# Patient Record
Sex: Female | Born: 1960 | Race: White | Hispanic: No | Marital: Married | Smoking: Former smoker
Health system: Southern US, Community
[De-identification: ages and names within clinical notes are randomized; demographics above are authoritative.]

## PROBLEM LIST (undated history)

## (undated) DIAGNOSIS — I2510 Atherosclerotic heart disease of native coronary artery without angina pectoris: Secondary | ICD-10-CM

## (undated) DIAGNOSIS — Z923 Personal history of irradiation: Secondary | ICD-10-CM

## (undated) DIAGNOSIS — C801 Malignant (primary) neoplasm, unspecified: Secondary | ICD-10-CM

## (undated) DIAGNOSIS — I10 Essential (primary) hypertension: Secondary | ICD-10-CM

## (undated) DIAGNOSIS — C50919 Malignant neoplasm of unspecified site of unspecified female breast: Secondary | ICD-10-CM

## (undated) HISTORY — DX: Essential (primary) hypertension: I10

## (undated) HISTORY — PX: OOPHORECTOMY: SHX86

## (undated) HISTORY — DX: Atherosclerotic heart disease of native coronary artery without angina pectoris: I25.10

## (undated) HISTORY — PX: BREAST LUMPECTOMY: SHX2

---

## 1992-03-12 HISTORY — PX: BACK SURGERY: SHX140

## 2001-03-12 DIAGNOSIS — C50919 Malignant neoplasm of unspecified site of unspecified female breast: Secondary | ICD-10-CM

## 2001-03-12 HISTORY — PX: BREAST LUMPECTOMY: SHX2

## 2001-03-12 HISTORY — DX: Malignant neoplasm of unspecified site of unspecified female breast: C50.919

## 2001-05-12 ENCOUNTER — Ambulatory Visit: Admission: RE | Admit: 2001-05-12 | Discharge: 2001-08-10 | Payer: Self-pay | Admitting: *Deleted

## 2005-02-22 ENCOUNTER — Other Ambulatory Visit: Admission: RE | Admit: 2005-02-22 | Discharge: 2005-02-22 | Payer: Self-pay | Admitting: Family Medicine

## 2005-08-01 ENCOUNTER — Encounter: Admission: RE | Admit: 2005-08-01 | Discharge: 2005-08-01 | Payer: Self-pay | Admitting: Hematology and Oncology

## 2005-12-31 ENCOUNTER — Encounter: Admission: RE | Admit: 2005-12-31 | Discharge: 2005-12-31 | Payer: Self-pay | Admitting: Hematology and Oncology

## 2006-06-24 ENCOUNTER — Other Ambulatory Visit: Admission: RE | Admit: 2006-06-24 | Discharge: 2006-06-24 | Payer: Self-pay | Admitting: Family Medicine

## 2007-05-10 ENCOUNTER — Emergency Department (HOSPITAL_COMMUNITY): Admission: EM | Admit: 2007-05-10 | Discharge: 2007-05-10 | Payer: Self-pay | Admitting: Emergency Medicine

## 2007-05-29 ENCOUNTER — Encounter: Admission: RE | Admit: 2007-05-29 | Discharge: 2007-05-29 | Payer: Self-pay | Admitting: Hematology and Oncology

## 2009-03-31 ENCOUNTER — Other Ambulatory Visit: Admission: RE | Admit: 2009-03-31 | Discharge: 2009-03-31 | Payer: Self-pay | Admitting: Family Medicine

## 2009-04-05 ENCOUNTER — Encounter: Admission: RE | Admit: 2009-04-05 | Discharge: 2009-04-05 | Payer: Self-pay | Admitting: Family Medicine

## 2009-09-05 ENCOUNTER — Encounter: Admission: RE | Admit: 2009-09-05 | Discharge: 2009-09-05 | Payer: Self-pay | Admitting: Hematology and Oncology

## 2010-04-02 ENCOUNTER — Encounter: Payer: Self-pay | Admitting: Hematology and Oncology

## 2010-12-20 ENCOUNTER — Other Ambulatory Visit: Payer: Self-pay | Admitting: Hematology and Oncology

## 2010-12-20 DIAGNOSIS — Z1231 Encounter for screening mammogram for malignant neoplasm of breast: Secondary | ICD-10-CM

## 2011-05-22 ENCOUNTER — Other Ambulatory Visit: Payer: Self-pay | Admitting: Family Medicine

## 2011-05-22 DIAGNOSIS — R110 Nausea: Secondary | ICD-10-CM

## 2011-05-22 DIAGNOSIS — IMO0001 Reserved for inherently not codable concepts without codable children: Secondary | ICD-10-CM

## 2011-05-25 ENCOUNTER — Ambulatory Visit
Admission: RE | Admit: 2011-05-25 | Discharge: 2011-05-25 | Disposition: A | Discharge status: Home / Self Care | Payer: BC Managed Care – PPO | Source: Ambulatory Visit | Attending: Family Medicine | Admitting: Family Medicine

## 2011-05-25 DIAGNOSIS — R110 Nausea: Secondary | ICD-10-CM

## 2011-05-25 DIAGNOSIS — IMO0001 Reserved for inherently not codable concepts without codable children: Secondary | ICD-10-CM

## 2011-05-29 ENCOUNTER — Other Ambulatory Visit (HOSPITAL_COMMUNITY): Payer: Self-pay | Admitting: Family Medicine

## 2011-05-29 DIAGNOSIS — R110 Nausea: Secondary | ICD-10-CM

## 2011-06-11 ENCOUNTER — Other Ambulatory Visit (HOSPITAL_COMMUNITY): Payer: BC Managed Care – PPO

## 2011-12-24 ENCOUNTER — Other Ambulatory Visit: Payer: Self-pay | Admitting: Hematology and Oncology

## 2011-12-24 DIAGNOSIS — Z1231 Encounter for screening mammogram for malignant neoplasm of breast: Secondary | ICD-10-CM

## 2012-01-14 ENCOUNTER — Ambulatory Visit
Admission: RE | Admit: 2012-01-14 | Discharge: 2012-01-14 | Disposition: A | Discharge status: Home / Self Care | Payer: BC Managed Care – PPO | Source: Ambulatory Visit | Attending: Hematology and Oncology | Admitting: Hematology and Oncology

## 2012-01-14 DIAGNOSIS — Z1231 Encounter for screening mammogram for malignant neoplasm of breast: Secondary | ICD-10-CM

## 2012-02-12 ENCOUNTER — Other Ambulatory Visit: Payer: Self-pay | Admitting: Family Medicine

## 2012-02-12 ENCOUNTER — Other Ambulatory Visit (HOSPITAL_COMMUNITY)
Admission: RE | Admit: 2012-02-12 | Discharge: 2012-02-12 | Disposition: A | Discharge status: Home / Self Care | Payer: BC Managed Care – PPO | Source: Ambulatory Visit | Attending: Family Medicine | Admitting: Family Medicine

## 2012-02-12 DIAGNOSIS — Z1151 Encounter for screening for human papillomavirus (HPV): Secondary | ICD-10-CM | POA: Insufficient documentation

## 2012-02-12 DIAGNOSIS — Z124 Encounter for screening for malignant neoplasm of cervix: Secondary | ICD-10-CM | POA: Insufficient documentation

## 2013-04-07 ENCOUNTER — Other Ambulatory Visit: Payer: Self-pay

## 2013-04-07 DIAGNOSIS — Z1231 Encounter for screening mammogram for malignant neoplasm of breast: Secondary | ICD-10-CM

## 2013-04-08 ENCOUNTER — Ambulatory Visit
Admission: RE | Admit: 2013-04-08 | Discharge: 2013-04-08 | Disposition: A | Discharge status: Home / Self Care | Payer: Self-pay | Source: Ambulatory Visit

## 2013-04-08 DIAGNOSIS — Z1231 Encounter for screening mammogram for malignant neoplasm of breast: Secondary | ICD-10-CM

## 2013-04-20 ENCOUNTER — Ambulatory Visit: Payer: BC Managed Care – PPO

## 2015-03-03 ENCOUNTER — Other Ambulatory Visit: Payer: Self-pay

## 2015-03-03 DIAGNOSIS — Z1231 Encounter for screening mammogram for malignant neoplasm of breast: Secondary | ICD-10-CM

## 2015-03-21 ENCOUNTER — Ambulatory Visit: Payer: Self-pay

## 2016-04-23 ENCOUNTER — Ambulatory Visit
Admission: RE | Admit: 2016-04-23 | Discharge: 2016-04-23 | Disposition: A | Discharge status: Home / Self Care | Payer: BLUE CROSS/BLUE SHIELD | Source: Ambulatory Visit

## 2016-04-23 DIAGNOSIS — Z1231 Encounter for screening mammogram for malignant neoplasm of breast: Secondary | ICD-10-CM | POA: Diagnosis not present

## 2016-04-23 HISTORY — DX: Malignant (primary) neoplasm, unspecified: C80.1

## 2016-04-23 HISTORY — DX: Malignant neoplasm of unspecified site of unspecified female breast: C50.919

## 2016-06-05 DIAGNOSIS — F411 Generalized anxiety disorder: Secondary | ICD-10-CM | POA: Diagnosis not present

## 2016-06-05 DIAGNOSIS — K219 Gastro-esophageal reflux disease without esophagitis: Secondary | ICD-10-CM | POA: Diagnosis not present

## 2016-06-05 DIAGNOSIS — J4520 Mild intermittent asthma, uncomplicated: Secondary | ICD-10-CM | POA: Diagnosis not present

## 2016-06-05 DIAGNOSIS — J309 Allergic rhinitis, unspecified: Secondary | ICD-10-CM | POA: Diagnosis not present

## 2017-04-29 ENCOUNTER — Other Ambulatory Visit: Payer: Self-pay | Admitting: Family Medicine

## 2017-04-29 DIAGNOSIS — Z1231 Encounter for screening mammogram for malignant neoplasm of breast: Secondary | ICD-10-CM

## 2017-05-17 ENCOUNTER — Ambulatory Visit
Admission: RE | Admit: 2017-05-17 | Discharge: 2017-05-17 | Disposition: A | Discharge status: Home / Self Care | Payer: BLUE CROSS/BLUE SHIELD | Source: Ambulatory Visit | Attending: Family Medicine | Admitting: Family Medicine

## 2017-05-17 DIAGNOSIS — Z1231 Encounter for screening mammogram for malignant neoplasm of breast: Secondary | ICD-10-CM | POA: Diagnosis not present

## 2017-09-30 DIAGNOSIS — Z853 Personal history of malignant neoplasm of breast: Secondary | ICD-10-CM | POA: Diagnosis not present

## 2017-09-30 DIAGNOSIS — F411 Generalized anxiety disorder: Secondary | ICD-10-CM | POA: Diagnosis not present

## 2017-09-30 DIAGNOSIS — Z1159 Encounter for screening for other viral diseases: Secondary | ICD-10-CM | POA: Diagnosis not present

## 2017-10-10 DIAGNOSIS — Z1211 Encounter for screening for malignant neoplasm of colon: Secondary | ICD-10-CM | POA: Diagnosis not present

## 2017-12-03 DIAGNOSIS — J309 Allergic rhinitis, unspecified: Secondary | ICD-10-CM | POA: Diagnosis not present

## 2017-12-03 DIAGNOSIS — Z6822 Body mass index (BMI) 22.0-22.9, adult: Secondary | ICD-10-CM | POA: Diagnosis not present

## 2017-12-03 DIAGNOSIS — J4520 Mild intermittent asthma, uncomplicated: Secondary | ICD-10-CM | POA: Diagnosis not present

## 2017-12-03 DIAGNOSIS — R030 Elevated blood-pressure reading, without diagnosis of hypertension: Secondary | ICD-10-CM | POA: Diagnosis not present

## 2018-10-10 ENCOUNTER — Other Ambulatory Visit: Payer: Self-pay | Admitting: Family Medicine

## 2018-10-10 DIAGNOSIS — Z1231 Encounter for screening mammogram for malignant neoplasm of breast: Secondary | ICD-10-CM

## 2019-04-24 ENCOUNTER — Ambulatory Visit
Admission: RE | Admit: 2019-04-24 | Discharge: 2019-04-24 | Disposition: A | Discharge status: Home / Self Care | Payer: 59 | Source: Ambulatory Visit | Attending: Family Medicine | Admitting: Family Medicine

## 2019-04-24 ENCOUNTER — Other Ambulatory Visit: Payer: Self-pay

## 2019-04-24 DIAGNOSIS — Z1231 Encounter for screening mammogram for malignant neoplasm of breast: Secondary | ICD-10-CM

## 2020-05-11 DIAGNOSIS — Z0000 Encounter for general adult medical examination without abnormal findings: Secondary | ICD-10-CM | POA: Diagnosis not present

## 2020-05-11 DIAGNOSIS — Z13220 Encounter for screening for lipoid disorders: Secondary | ICD-10-CM | POA: Diagnosis not present

## 2020-05-11 DIAGNOSIS — Z124 Encounter for screening for malignant neoplasm of cervix: Secondary | ICD-10-CM | POA: Diagnosis not present

## 2020-05-11 DIAGNOSIS — F411 Generalized anxiety disorder: Secondary | ICD-10-CM | POA: Diagnosis not present

## 2020-05-11 DIAGNOSIS — B351 Tinea unguium: Secondary | ICD-10-CM | POA: Diagnosis not present

## 2020-05-11 DIAGNOSIS — J4520 Mild intermittent asthma, uncomplicated: Secondary | ICD-10-CM | POA: Diagnosis not present

## 2020-05-11 DIAGNOSIS — K219 Gastro-esophageal reflux disease without esophagitis: Secondary | ICD-10-CM | POA: Diagnosis not present

## 2020-10-05 DIAGNOSIS — U071 COVID-19: Secondary | ICD-10-CM | POA: Diagnosis not present

## 2020-11-07 ENCOUNTER — Other Ambulatory Visit: Payer: Self-pay | Admitting: Family Medicine

## 2020-11-07 DIAGNOSIS — Z1231 Encounter for screening mammogram for malignant neoplasm of breast: Secondary | ICD-10-CM

## 2020-12-14 ENCOUNTER — Other Ambulatory Visit: Payer: Self-pay

## 2020-12-14 ENCOUNTER — Ambulatory Visit
Admission: RE | Admit: 2020-12-14 | Discharge: 2020-12-14 | Disposition: A | Discharge status: Home / Self Care | Payer: BLUE CROSS/BLUE SHIELD | Source: Ambulatory Visit | Attending: Family Medicine | Admitting: Family Medicine

## 2020-12-14 DIAGNOSIS — Z1231 Encounter for screening mammogram for malignant neoplasm of breast: Secondary | ICD-10-CM | POA: Diagnosis not present

## 2021-05-22 DIAGNOSIS — J4520 Mild intermittent asthma, uncomplicated: Secondary | ICD-10-CM | POA: Diagnosis not present

## 2021-05-22 DIAGNOSIS — F411 Generalized anxiety disorder: Secondary | ICD-10-CM | POA: Diagnosis not present

## 2021-05-22 DIAGNOSIS — Z13220 Encounter for screening for lipoid disorders: Secondary | ICD-10-CM | POA: Diagnosis not present

## 2021-05-22 DIAGNOSIS — J309 Allergic rhinitis, unspecified: Secondary | ICD-10-CM | POA: Diagnosis not present

## 2021-05-22 DIAGNOSIS — K219 Gastro-esophageal reflux disease without esophagitis: Secondary | ICD-10-CM | POA: Diagnosis not present

## 2021-05-22 DIAGNOSIS — Z0000 Encounter for general adult medical examination without abnormal findings: Secondary | ICD-10-CM | POA: Diagnosis not present

## 2021-07-11 DIAGNOSIS — J4541 Moderate persistent asthma with (acute) exacerbation: Secondary | ICD-10-CM | POA: Diagnosis not present

## 2021-12-01 ENCOUNTER — Other Ambulatory Visit: Payer: Self-pay | Admitting: Family Medicine

## 2021-12-01 DIAGNOSIS — Z1231 Encounter for screening mammogram for malignant neoplasm of breast: Secondary | ICD-10-CM

## 2021-12-18 ENCOUNTER — Inpatient Hospital Stay (HOSPITAL_COMMUNITY)
Admission: EM | Admit: 2021-12-18 | Discharge: 2021-12-21 | DRG: 281 | Disposition: A | Discharge status: Home / Self Care | Payer: BC Managed Care – PPO | Attending: Cardiovascular Disease | Admitting: Cardiovascular Disease

## 2021-12-18 ENCOUNTER — Other Ambulatory Visit: Payer: Self-pay

## 2021-12-18 ENCOUNTER — Encounter (HOSPITAL_COMMUNITY): Payer: Self-pay

## 2021-12-18 ENCOUNTER — Emergency Department (HOSPITAL_COMMUNITY): Payer: BC Managed Care – PPO

## 2021-12-18 DIAGNOSIS — Z820 Family history of epilepsy and other diseases of the nervous system: Secondary | ICD-10-CM | POA: Diagnosis not present

## 2021-12-18 DIAGNOSIS — Z8249 Family history of ischemic heart disease and other diseases of the circulatory system: Secondary | ICD-10-CM | POA: Diagnosis not present

## 2021-12-18 DIAGNOSIS — Z79899 Other long term (current) drug therapy: Secondary | ICD-10-CM

## 2021-12-18 DIAGNOSIS — I252 Old myocardial infarction: Secondary | ICD-10-CM

## 2021-12-18 DIAGNOSIS — I110 Hypertensive heart disease with heart failure: Secondary | ICD-10-CM | POA: Diagnosis not present

## 2021-12-18 DIAGNOSIS — I255 Ischemic cardiomyopathy: Secondary | ICD-10-CM | POA: Diagnosis present

## 2021-12-18 DIAGNOSIS — I214 Non-ST elevation (NSTEMI) myocardial infarction: Principal | ICD-10-CM | POA: Diagnosis present

## 2021-12-18 DIAGNOSIS — Z881 Allergy status to other antibiotic agents status: Secondary | ICD-10-CM

## 2021-12-18 DIAGNOSIS — Z801 Family history of malignant neoplasm of trachea, bronchus and lung: Secondary | ICD-10-CM

## 2021-12-18 DIAGNOSIS — I5022 Chronic systolic (congestive) heart failure: Secondary | ICD-10-CM | POA: Diagnosis not present

## 2021-12-18 DIAGNOSIS — I10 Essential (primary) hypertension: Secondary | ICD-10-CM

## 2021-12-18 DIAGNOSIS — Z888 Allergy status to other drugs, medicaments and biological substances status: Secondary | ICD-10-CM

## 2021-12-18 DIAGNOSIS — R079 Chest pain, unspecified: Secondary | ICD-10-CM | POA: Diagnosis not present

## 2021-12-18 DIAGNOSIS — E785 Hyperlipidemia, unspecified: Secondary | ICD-10-CM | POA: Diagnosis present

## 2021-12-18 DIAGNOSIS — J45909 Unspecified asthma, uncomplicated: Secondary | ICD-10-CM | POA: Diagnosis present

## 2021-12-18 DIAGNOSIS — Z880 Allergy status to penicillin: Secondary | ICD-10-CM

## 2021-12-18 DIAGNOSIS — Z853 Personal history of malignant neoplasm of breast: Secondary | ICD-10-CM | POA: Diagnosis not present

## 2021-12-18 DIAGNOSIS — I2510 Atherosclerotic heart disease of native coronary artery without angina pectoris: Secondary | ICD-10-CM | POA: Diagnosis not present

## 2021-12-18 DIAGNOSIS — Z7982 Long term (current) use of aspirin: Secondary | ICD-10-CM

## 2021-12-18 LAB — BASIC METABOLIC PANEL
Anion gap: 9 (ref 5–15)
BUN: 12 mg/dL (ref 8–23)
CO2: 28 mmol/L (ref 22–32)
Calcium: 9.7 mg/dL (ref 8.9–10.3)
Chloride: 105 mmol/L (ref 98–111)
Creatinine, Ser: 0.87 mg/dL (ref 0.44–1.00)
GFR, Estimated: >60 mL/min (ref >60)
Glucose, Bld: 126 mg/dL — ABNORMAL HIGH (ref 70–99)
Potassium: 3.9 mmol/L (ref 3.5–5.1)
Sodium: 142 mmol/L (ref 135–145)

## 2021-12-18 LAB — TROPONIN I (HIGH SENSITIVITY)
Troponin I (High Sensitivity): 203 ng/L (ref <18)
Troponin I (High Sensitivity): 596 ng/L (ref <18)
Troponin I (High Sensitivity): 866 ng/L (ref <18)

## 2021-12-18 LAB — CBC
HCT: 44.8 % (ref 36.0–46.0)
Hemoglobin: 14.9 g/dL (ref 12.0–15.0)
MCH: 31.7 pg (ref 26.0–34.0)
MCHC: 33.3 g/dL (ref 30.0–36.0)
MCV: 95.3 fL (ref 80.0–100.0)
Platelets: UNDETERMINED 10*3/uL (ref 150–400)
RBC: 4.7 MIL/uL (ref 3.87–5.11)
RDW: 11.9 % (ref 11.5–15.5)
WBC: 9.1 10*3/uL (ref 4.0–10.5)
nRBC: 0 % (ref 0.0–0.2)

## 2021-12-18 LAB — CBG MONITORING, ED: Glucose-Capillary: 126 mg/dL — ABNORMAL HIGH (ref 70–99)

## 2021-12-18 LAB — HEPARIN LEVEL (UNFRACTIONATED): Heparin Unfractionated: 0.45 IU/mL (ref 0.30–0.70)

## 2021-12-18 MED ORDER — NITROGLYCERIN 0.4 MG SL SUBL: 0.4000 mg | SUBLINGUAL_TABLET | SUBLINGUAL | Status: DC | PRN

## 2021-12-18 MED ORDER — NITROGLYCERIN IN D5W 200-5 MCG/ML-% IV SOLN
0.0000 ug/min | INTRAVENOUS | Status: DC
  Administered 2021-12-18: 5 ug/min via INTRAVENOUS
  Filled 2021-12-18 (×2): qty 250

## 2021-12-18 MED ORDER — SODIUM CHLORIDE 0.9 % IV SOLN: INTRAVENOUS | Status: DC

## 2021-12-18 MED ORDER — ASPIRIN 81 MG PO CHEW
81.0000 mg | CHEWABLE_TABLET | Freq: Every day | ORAL | Status: DC
  Administered 2021-12-19 – 2021-12-21 (×3): 81 mg via ORAL
  Filled 2021-12-18 (×3): qty 1

## 2021-12-18 MED ORDER — HEPARIN BOLUS VIA INFUSION
3500.0000 [IU] | Freq: Once | INTRAVENOUS | Status: AC
  Administered 2021-12-18: 3500 [IU] via INTRAVENOUS
  Filled 2021-12-18: qty 3500

## 2021-12-18 MED ORDER — HEPARIN (PORCINE) 25000 UT/250ML-% IV SOLN
750.0000 [IU]/h | INTRAVENOUS | Status: DC
  Administered 2021-12-18: 750 [IU]/h via INTRAVENOUS
  Filled 2021-12-18: qty 250

## 2021-12-18 MED ORDER — ATORVASTATIN CALCIUM 80 MG PO TABS
80.0000 mg | ORAL_TABLET | Freq: Every day | ORAL | Status: DC
  Administered 2021-12-18 – 2021-12-19 (×2): 80 mg via ORAL
  Filled 2021-12-18 (×2): qty 2

## 2021-12-18 MED ORDER — MORPHINE SULFATE (PF) 4 MG/ML IV SOLN: 4.0000 mg | INTRAVENOUS | Status: DC | PRN

## 2021-12-18 MED ORDER — ALPRAZOLAM 0.5 MG PO TABS
0.5000 mg | ORAL_TABLET | Freq: Once | ORAL | Status: AC
  Administered 2021-12-19: 0.5 mg via ORAL
  Filled 2021-12-18: qty 1

## 2021-12-18 MED ORDER — METOPROLOL TARTRATE 25 MG PO TABS
25.0000 mg | ORAL_TABLET | Freq: Two times a day (BID) | ORAL | Status: DC
  Administered 2021-12-18: 25 mg via ORAL
  Filled 2021-12-18: qty 1

## 2021-12-18 MED ORDER — ASPIRIN 81 MG PO CHEW
324.0000 mg | CHEWABLE_TABLET | Freq: Once | ORAL | Status: AC
  Administered 2021-12-18: 324 mg via ORAL
  Filled 2021-12-18: qty 4

## 2021-12-18 NOTE — ED Triage Notes (Addendum)
Pt reports left sided chest pain that radiates to left arm x 2 hours. Also c/o nausea.

## 2021-12-18 NOTE — Progress Notes (Signed)
[place] for IV heparin Indication: chest pain/ACS  Allergies  Allergen Reactions   Amoxicillin Swelling    Swelling of throat    Avelox [Moxifloxacin] Other (See Comments)    "Deathly sick"    Celexa [Citalopram Hydrobromide] Other (See Comments)    Nightmares    Codeine Other (See Comments)    Gi upset    Doxycycline Other (See Comments)    Reflux    Keflex [Cephalexin] Other (See Comments)    Unknown    Penicillins Other (See Comments)    Childhood reaction. "About died when I was a baby"     Patient Measurements: Height: '5\' 6"'$  (167.6 cm) Weight: 59 kg (130 lb) IBW/kg (Calculated) : 59.3 Heparin Dosing Weight: TBW  Vital Signs: Temp: 97.7 F (36.5 C) (10/09 1008) Temp Source: Oral (10/09 1006) BP: 155/95 (10/09 1015) Pulse Rate: 68 (10/09 1015)  Labs: Recent Labs    12/18/21 1035 12/18/21 1118  HGB  --  14.9  HCT  --  44.8  PLT  --  PLATELET CLUMPS NOTED ON SMEAR, UNABLE TO ESTIMATE  CREATININE 0.87  --   TROPONINIHS 203*  --     Estimated Creatinine Clearance: 62.4 mL/min (by C-G formula based on SCr of 0.87 mg/dL).   Medical History: Past Medical History:  Diagnosis Date   Breast cancer ([place]) 2003   Lt breast   Cancer ([place])     Medications:  (Not in a hospital admission)  Scheduled:  PRN: morphine injection, nitroGLYCERIN  Assessment: 59 yoF with no PMH in Epic presents with L-sided chest pain radiating to arm x 2 hr, nausea. Troponins elevated x 1. Pharmacy consulted to dose IV heparin for ACS.  Baseline INR, aPTT: not done Prior anticoagulation: none  Significant events:  Today, 12/18/2021: CBC: Hgb WNL; Plt clumped on smear, unable to count. Per MD, no Hx or evidence suspicious for thrombocytopenia or other hematological disease No bleeding or infusion issues per nursing SCr WNL  Goal of Therapy: Heparin level 0.3-0.7 units/ml Monitor platelets by anticoagulation protocol:  Yes  Plan: Heparin 3500 units IV bolus x 1 Heparin 700 units/hr IV infusion Check heparin level 8 hrs after start Daily CBC, daily heparin level once stable Monitor for signs of bleeding or thrombosis  Reuel Boom, PharmD, BCPS [phone] 12/18/2021, 12:18 PM

## 2021-12-18 NOTE — H&P (Signed)
Non-ST elevation MI.  Risk factors positive.  Coronary angiography to define anatomy and help manage disease subset

## 2021-12-18 NOTE — ED Notes (Signed)
Pt requesting nighttime anti-anxiety medication. RN notified.

## 2021-12-18 NOTE — ED Provider Notes (Signed)
Mary Vance   CSN: [id number] Arrival date & time: 12/18/21  4315     History  Chief Complaint  Patient presents with   Chest Pain    Mary Vance is a 61 y.o. female.  61 yo F with a chief complaints of chest pain.  This started today when she was walking to a court house.  Tells me that she developed some pain to the left side of her chest that radiated to the left arm.  Started while she was walking up a hill.  She had to stop a couple times to catch her breath became a bit nauseated but denies any vomiting.  She got into the court house and sat down and thought that her symptoms would likely get better but they persisted and so she ended up coming to the hospital for evaluation.  Patient denies history of MI, denies hypertension hyperlipidemia diabetes or smoking.  Her mother had an MI in her 13s.  Patient denies history of PE or DVT denies hemoptysis denies unilateral lower extremity edema denies recent surgery immobilization hospitalization estrogen use.  She has a remote history of a lumpectomy.     Chest Pain      Home Medications Prior to Admission medications   Not on File      Allergies    Penicillins    Review of Systems   Review of Systems  Cardiovascular:  Positive for chest pain.    Physical Exam Updated Vital Signs BP (!) 171/89 (BP Location: Right Arm)   Pulse 66   Temp 97.7 F (36.5 C)   Resp 17   Ht '5\' 6"'$  (1.676 m)   Wt 59 kg   SpO2 99%   BMI 20.98 kg/m  Physical Exam Vitals and nursing Vance reviewed.  Constitutional:      General: She is not in acute distress.    Appearance: She is well-developed. She is not diaphoretic.  HENT:     Head: Normocephalic and atraumatic.  Eyes:     Pupils: Pupils are equal, round, and reactive to light.  Cardiovascular:     Rate and Rhythm: Normal rate and regular rhythm.     Heart sounds: No murmur heard.    No friction rub. No gallop.  Pulmonary:      Effort: Pulmonary effort is normal.     Breath sounds: No wheezing or rales.  Chest:     Chest wall: Tenderness present.     Comments: She has some pain over her left lumpectomy site tells me that is chronic and unchanged.  Not similar to the pain that she is experiencing. Abdominal:     General: There is no distension.     Palpations: Abdomen is soft.     Tenderness: There is no abdominal tenderness.  Musculoskeletal:        General: No tenderness.     Cervical back: Normal range of motion and neck supple.  Skin:    General: Skin is warm and dry.  Neurological:     Mental Status: She is alert and oriented to person, place, and time.  Psychiatric:        Behavior: Behavior normal.     ED Results / Procedures / Treatments   Labs (all labs ordered are listed, but only abnormal results are displayed) Labs Reviewed  BASIC METABOLIC PANEL  CBC  CBG MONITORING, ED  TROPONIN I (HIGH SENSITIVITY)    EKG None  Radiology No results  found.  Procedures Procedures    Medications Ordered in ED Medications  aspirin chewable tablet 324 mg (has no administration in time range)  nitroGLYCERIN (NITROSTAT) SL tablet 0.4 mg (has no administration in time range)  morphine (PF) 4 MG/ML injection 4 mg (has no administration in time range)    ED Course/ Medical Decision Making/ A&P                           Medical Decision Making Amount and/or Complexity of Data Reviewed Labs: ordered. Radiology: ordered. ECG/medicine tests: ordered.  Risk OTC drugs. Prescription drug management. Decision regarding hospitalization.   61 yo F with a cc of chest pain.  Started this morning when she was walking up a hill.   EKG, cxr, trop x 2.   Patient's initial EKG with some diffuse ST depressions without obvious elevation.  Initial troponins 200.  Delta 596.  I discussed case with Dr. Harrington Challenger, cardiology recommended admission at [facility].  CRITICAL CARE Performed by: Cecilio Asper   Total critical care time: 35 minutes  Critical care time was exclusive of separately billable procedures and treating other patients.  Critical care was necessary to treat or prevent imminent or life-threatening deterioration.  Critical care was time spent personally by me on the following activities: development of treatment plan with patient and/or surrogate as well as nursing, discussions with consultants, evaluation of patient's response to treatment, examination of patient, obtaining history from patient or surrogate, ordering and performing treatments and interventions, ordering and review of laboratory studies, ordering and review of radiographic studies, pulse oximetry and re-evaluation of patient's condition.  The patients results and plan were reviewed and discussed.   Any x-rays performed were independently reviewed by myself.   Differential diagnosis were considered with the presenting HPI.  Medications  nitroGLYCERIN (NITROSTAT) SL tablet 0.4 mg (has no administration in time range)  morphine (PF) 4 MG/ML injection 4 mg (has no administration in time range)  heparin ADULT infusion 100 units/mL (25000 units/281m) (750 Units/hr Intravenous New Bag/Given 12/18/21 1300)  nitroGLYCERIN 50 mg in dextrose 5 % 250 mL (0.2 mg/mL) infusion (has no administration in time range)  aspirin chewable tablet 324 mg (324 mg Oral Given 12/18/21 1033)  heparin bolus via infusion 3,500 Units (3,500 Units Intravenous Bolus from Bag 12/18/21 1300)    Vitals:   12/18/21 1200 12/18/21 1230 12/18/21 1300 12/18/21 1420  BP: (!) 149/96  (!) 144/84   Pulse: 75  71   Resp: 14 14    Temp:    97.8 F (36.6 C)  TempSrc:    Oral  SpO2: 98%  99%   Weight:      Height:        Final diagnoses:  NSTEMI (non-ST elevated myocardial infarction) ([facility]    Admission/ observation were discussed with the admitting physician, patient and/or family and they are comfortable with the plan.           Final Clinical Impression(s) / ED Diagnoses Final diagnoses:  None    Rx / DC Orders ED Discharge Orders     None         FDeno Etienne DO 12/18/21 1529

## 2021-12-19 ENCOUNTER — Encounter (HOSPITAL_COMMUNITY)
Admission: EM | Disposition: A | Discharge status: Home / Self Care | Payer: Self-pay | Source: Home / Self Care | Attending: Internal Medicine

## 2021-12-19 ENCOUNTER — Encounter (HOSPITAL_COMMUNITY): Payer: Self-pay | Admitting: Internal Medicine

## 2021-12-19 ENCOUNTER — Observation Stay (HOSPITAL_BASED_OUTPATIENT_CLINIC_OR_DEPARTMENT_OTHER): Payer: BC Managed Care – PPO

## 2021-12-19 DIAGNOSIS — I2510 Atherosclerotic heart disease of native coronary artery without angina pectoris: Secondary | ICD-10-CM | POA: Diagnosis not present

## 2021-12-19 DIAGNOSIS — Z881 Allergy status to other antibiotic agents status: Secondary | ICD-10-CM | POA: Diagnosis not present

## 2021-12-19 DIAGNOSIS — I214 Non-ST elevation (NSTEMI) myocardial infarction: Principal | ICD-10-CM

## 2021-12-19 DIAGNOSIS — I5022 Chronic systolic (congestive) heart failure: Secondary | ICD-10-CM | POA: Diagnosis present

## 2021-12-19 DIAGNOSIS — Z853 Personal history of malignant neoplasm of breast: Secondary | ICD-10-CM | POA: Diagnosis not present

## 2021-12-19 DIAGNOSIS — Z820 Family history of epilepsy and other diseases of the nervous system: Secondary | ICD-10-CM | POA: Diagnosis not present

## 2021-12-19 DIAGNOSIS — I255 Ischemic cardiomyopathy: Secondary | ICD-10-CM | POA: Diagnosis present

## 2021-12-19 DIAGNOSIS — Z8249 Family history of ischemic heart disease and other diseases of the circulatory system: Secondary | ICD-10-CM | POA: Diagnosis not present

## 2021-12-19 DIAGNOSIS — I110 Hypertensive heart disease with heart failure: Secondary | ICD-10-CM | POA: Diagnosis present

## 2021-12-19 DIAGNOSIS — Z801 Family history of malignant neoplasm of trachea, bronchus and lung: Secondary | ICD-10-CM | POA: Diagnosis not present

## 2021-12-19 DIAGNOSIS — Z7982 Long term (current) use of aspirin: Secondary | ICD-10-CM | POA: Diagnosis not present

## 2021-12-19 DIAGNOSIS — J45909 Unspecified asthma, uncomplicated: Secondary | ICD-10-CM | POA: Diagnosis present

## 2021-12-19 DIAGNOSIS — Z79899 Other long term (current) drug therapy: Secondary | ICD-10-CM | POA: Diagnosis not present

## 2021-12-19 DIAGNOSIS — Z888 Allergy status to other drugs, medicaments and biological substances status: Secondary | ICD-10-CM | POA: Diagnosis not present

## 2021-12-19 DIAGNOSIS — E785 Hyperlipidemia, unspecified: Secondary | ICD-10-CM | POA: Diagnosis present

## 2021-12-19 DIAGNOSIS — I10 Essential (primary) hypertension: Secondary | ICD-10-CM

## 2021-12-19 DIAGNOSIS — I252 Old myocardial infarction: Secondary | ICD-10-CM | POA: Diagnosis not present

## 2021-12-19 DIAGNOSIS — Z880 Allergy status to penicillin: Secondary | ICD-10-CM | POA: Diagnosis not present

## 2021-12-19 HISTORY — PX: LEFT HEART CATH AND CORONARY ANGIOGRAPHY: CATH118249

## 2021-12-19 LAB — ECHOCARDIOGRAM COMPLETE
AR max vel: 2.26 cm2
AV Area VTI: 2.22 cm2
AV Area mean vel: 2.07 cm2
AV Mean grad: 2 mmHg
AV Peak grad: 3.6 mmHg
Ao pk vel: 0.95 m/s
Area-P 1/2: 5.13 cm2
Calc EF: 45.7 %
Height: 66 in
MV M vel: 2.32 m/s
MV Peak grad: 21.5 mmHg
S' Lateral: 3.7 cm
Single Plane A2C EF: 45 %
Single Plane A4C EF: 43.2 %
Weight: 2080 oz

## 2021-12-19 LAB — CBC
HCT: 43.7 % (ref 36.0–46.0)
Hemoglobin: 14.7 g/dL (ref 12.0–15.0)
MCH: 32 pg (ref 26.0–34.0)
MCHC: 33.6 g/dL (ref 30.0–36.0)
MCV: 95.2 fL (ref 80.0–100.0)
Platelets: 164 10*3/uL (ref 150–400)
RBC: 4.59 MIL/uL (ref 3.87–5.11)
RDW: 12.1 % (ref 11.5–15.5)
WBC: 7.4 10*3/uL (ref 4.0–10.5)
nRBC: 0 % (ref 0.0–0.2)

## 2021-12-19 LAB — BASIC METABOLIC PANEL
Anion gap: 7 (ref 5–15)
BUN: 12 mg/dL (ref 8–23)
CO2: 28 mmol/L (ref 22–32)
Calcium: 9.3 mg/dL (ref 8.9–10.3)
Chloride: 106 mmol/L (ref 98–111)
Creatinine, Ser: 0.77 mg/dL (ref 0.44–1.00)
GFR, Estimated: >60 mL/min (ref >60)
Glucose, Bld: 122 mg/dL — ABNORMAL HIGH (ref 70–99)
Potassium: 3.9 mmol/L (ref 3.5–5.1)
Sodium: 141 mmol/L (ref 135–145)

## 2021-12-19 LAB — LIPID PANEL
Cholesterol: 191 mg/dL (ref 0–200)
HDL: 52 mg/dL (ref >40)
LDL Cholesterol: 125 mg/dL — ABNORMAL HIGH (ref 0–99)
Total CHOL/HDL Ratio: 3.7 RATIO
Triglycerides: 70 mg/dL (ref <150)
VLDL: 14 mg/dL (ref 0–40)

## 2021-12-19 LAB — HEMOGLOBIN A1C
Hgb A1c MFr Bld: 5.5 % (ref 4.8–5.6)
Mean Plasma Glucose: 111.15 mg/dL

## 2021-12-19 LAB — MAGNESIUM: Magnesium: 2.1 mg/dL (ref 1.7–2.4)

## 2021-12-19 LAB — TSH: TSH: 1.353 u[IU]/mL (ref 0.350–4.500)

## 2021-12-19 LAB — HEPARIN LEVEL (UNFRACTIONATED): Heparin Unfractionated: 0.39 IU/mL (ref 0.30–0.70)

## 2021-12-19 SURGERY — LEFT HEART CATH AND CORONARY ANGIOGRAPHY
Anesthesia: LOCAL

## 2021-12-19 MED ORDER — SODIUM CHLORIDE 0.9% FLUSH: 3.0000 mL | INTRAVENOUS | Status: DC | PRN

## 2021-12-19 MED ORDER — ALPRAZOLAM 0.5 MG PO TABS
0.5000 mg | ORAL_TABLET | Freq: Every day | ORAL | Status: DC | PRN
  Administered 2021-12-19 – 2021-12-20 (×2): 0.5 mg via ORAL
  Filled 2021-12-19 (×2): qty 1

## 2021-12-19 MED ORDER — IOHEXOL 350 MG/ML SOLN
INTRAVENOUS | Status: DC | PRN
  Administered 2021-12-19: 40 mL

## 2021-12-19 MED ORDER — VERAPAMIL HCL 2.5 MG/ML IV SOLN
INTRAVENOUS | Status: AC
  Filled 2021-12-19: qty 2

## 2021-12-19 MED ORDER — LIDOCAINE HCL (PF) 1 % IJ SOLN
INTRAMUSCULAR | Status: DC | PRN
  Administered 2021-12-19: 2 mL

## 2021-12-19 MED ORDER — ONDANSETRON 4 MG PO TBDP
4.0000 mg | ORAL_TABLET | Freq: Three times a day (TID) | ORAL | Status: DC | PRN
  Administered 2021-12-19: 4 mg via ORAL
  Filled 2021-12-19: qty 1

## 2021-12-19 MED ORDER — ACETAMINOPHEN 325 MG PO TABS
650.0000 mg | ORAL_TABLET | ORAL | Status: DC | PRN
  Administered 2021-12-20: 650 mg via ORAL
  Filled 2021-12-19: qty 2

## 2021-12-19 MED ORDER — ONDANSETRON HCL 4 MG/2ML IJ SOLN: 4.0000 mg | Freq: Four times a day (QID) | INTRAMUSCULAR | Status: DC | PRN

## 2021-12-19 MED ORDER — CLOPIDOGREL BISULFATE 300 MG PO TABS
ORAL_TABLET | ORAL | Status: AC
  Filled 2021-12-19: qty 1

## 2021-12-19 MED ORDER — METOPROLOL SUCCINATE ER 50 MG PO TB24
50.0000 mg | ORAL_TABLET | Freq: Every day | ORAL | Status: DC
  Administered 2021-12-19 – 2021-12-21 (×3): 50 mg via ORAL
  Filled 2021-12-19 (×3): qty 1

## 2021-12-19 MED ORDER — LEVALBUTEROL HCL 0.63 MG/3ML IN NEBU
0.6300 mg | INHALATION_SOLUTION | Freq: Three times a day (TID) | RESPIRATORY_TRACT | Status: DC | PRN
  Administered 2021-12-19 – 2021-12-20 (×3): 0.63 mg via RESPIRATORY_TRACT
  Filled 2021-12-19 (×3): qty 3

## 2021-12-19 MED ORDER — SODIUM CHLORIDE 0.9 % IV SOLN: INTRAVENOUS | Status: AC

## 2021-12-19 MED ORDER — VERAPAMIL HCL 2.5 MG/ML IV SOLN
INTRAVENOUS | Status: DC | PRN
  Administered 2021-12-19: 10 mL via INTRA_ARTERIAL

## 2021-12-19 MED ORDER — FENTANYL CITRATE (PF) 100 MCG/2ML IJ SOLN
INTRAMUSCULAR | Status: DC | PRN
  Administered 2021-12-19: 50 ug via INTRAVENOUS

## 2021-12-19 MED ORDER — HEPARIN (PORCINE) 25000 UT/250ML-% IV SOLN
750.0000 [IU]/h | INTRAVENOUS | Status: DC
  Administered 2021-12-19: 750 [IU]/h via INTRAVENOUS
  Filled 2021-12-19: qty 250

## 2021-12-19 MED ORDER — CLOPIDOGREL BISULFATE 75 MG PO TABS
75.0000 mg | ORAL_TABLET | Freq: Every day | ORAL | Status: DC
  Administered 2021-12-20 – 2021-12-21 (×2): 75 mg via ORAL
  Filled 2021-12-19 (×2): qty 1

## 2021-12-19 MED ORDER — FENTANYL CITRATE (PF) 100 MCG/2ML IJ SOLN
INTRAMUSCULAR | Status: AC
  Filled 2021-12-19: qty 2

## 2021-12-19 MED ORDER — NITROGLYCERIN IN D5W 200-5 MCG/ML-% IV SOLN
5.0000 ug/min | INTRAVENOUS | Status: DC
  Administered 2021-12-19: 5 ug/min via INTRAVENOUS

## 2021-12-19 MED ORDER — HEPARIN (PORCINE) IN NACL 1000-0.9 UT/500ML-% IV SOLN
INTRAVENOUS | Status: DC | PRN
  Administered 2021-12-19 (×2): 500 mL

## 2021-12-19 MED ORDER — LABETALOL HCL 5 MG/ML IV SOLN: 10.0000 mg | INTRAVENOUS | Status: AC | PRN

## 2021-12-19 MED ORDER — SODIUM CHLORIDE 0.9 % IV SOLN: 250.0000 mL | INTRAVENOUS | Status: DC | PRN

## 2021-12-19 MED ORDER — CARVEDILOL 3.125 MG PO TABS: 6.2500 mg | ORAL_TABLET | Freq: Two times a day (BID) | ORAL | Status: DC

## 2021-12-19 MED ORDER — ACETAMINOPHEN 325 MG PO TABS: 650.0000 mg | ORAL_TABLET | ORAL | Status: DC | PRN

## 2021-12-19 MED ORDER — ATORVASTATIN CALCIUM 80 MG PO TABS
80.0000 mg | ORAL_TABLET | Freq: Every day | ORAL | Status: DC
  Administered 2021-12-20 – 2021-12-21 (×2): 80 mg via ORAL
  Filled 2021-12-19 (×2): qty 1

## 2021-12-19 MED ORDER — HEPARIN SODIUM (PORCINE) 1000 UNIT/ML IJ SOLN
INTRAMUSCULAR | Status: DC | PRN
  Administered 2021-12-19: 3000 [IU] via INTRAVENOUS

## 2021-12-19 MED ORDER — LOSARTAN POTASSIUM 25 MG PO TABS
25.0000 mg | ORAL_TABLET | Freq: Every day | ORAL | Status: DC
  Administered 2021-12-19 – 2021-12-20 (×2): 25 mg via ORAL
  Filled 2021-12-19 (×2): qty 1

## 2021-12-19 MED ORDER — ATORVASTATIN CALCIUM 80 MG PO TABS: 80.0000 mg | ORAL_TABLET | Freq: Every day | ORAL | Status: DC

## 2021-12-19 MED ORDER — HEPARIN SODIUM (PORCINE) 1000 UNIT/ML IJ SOLN
INTRAMUSCULAR | Status: AC
  Filled 2021-12-19: qty 10

## 2021-12-19 MED ORDER — LIDOCAINE HCL (PF) 1 % IJ SOLN
INTRAMUSCULAR | Status: AC
  Filled 2021-12-19: qty 30

## 2021-12-19 MED ORDER — HEPARIN (PORCINE) IN NACL 1000-0.9 UT/500ML-% IV SOLN
INTRAVENOUS | Status: AC
  Filled 2021-12-19: qty 1000

## 2021-12-19 MED ORDER — MIDAZOLAM HCL 2 MG/2ML IJ SOLN
INTRAMUSCULAR | Status: AC
  Filled 2021-12-19: qty 2

## 2021-12-19 MED ORDER — FAMOTIDINE 20 MG PO TABS
20.0000 mg | ORAL_TABLET | Freq: Two times a day (BID) | ORAL | Status: DC
  Administered 2021-12-19 – 2021-12-21 (×4): 20 mg via ORAL
  Filled 2021-12-19 (×4): qty 1

## 2021-12-19 MED ORDER — MIDAZOLAM HCL 2 MG/2ML IJ SOLN
INTRAMUSCULAR | Status: DC | PRN
  Administered 2021-12-19: 1 mg via INTRAVENOUS

## 2021-12-19 MED ORDER — ASPIRIN 81 MG PO CHEW: 81.0000 mg | CHEWABLE_TABLET | Freq: Every day | ORAL | Status: DC

## 2021-12-19 MED ORDER — CLOPIDOGREL BISULFATE 300 MG PO TABS
ORAL_TABLET | ORAL | Status: DC | PRN
  Administered 2021-12-19: 300 mg via ORAL

## 2021-12-19 MED ORDER — PERFLUTREN LIPID MICROSPHERE
1.0000 mL | INTRAVENOUS | Status: AC | PRN
  Administered 2021-12-19: 3 mL via INTRAVENOUS

## 2021-12-19 MED ORDER — SODIUM CHLORIDE 0.9% FLUSH
3.0000 mL | Freq: Two times a day (BID) | INTRAVENOUS | Status: DC
  Administered 2021-12-19 – 2021-12-20 (×3): 3 mL via INTRAVENOUS

## 2021-12-19 MED ORDER — HYDRALAZINE HCL 20 MG/ML IJ SOLN: 10.0000 mg | INTRAMUSCULAR | Status: AC | PRN

## 2021-12-19 SURGICAL SUPPLY — 10 items
BAND CMPR LRG ZPHR (HEMOSTASIS) ×1
BAND ZEPHYR COMPRESS 30 LONG (HEMOSTASIS) IMPLANT
CATH 5FR JL3.5 JR4 ANG PIG MP (CATHETERS) IMPLANT
GLIDESHEATH SLEND A-KIT 6F 22G (SHEATH) IMPLANT
GUIDEWIRE INQWIRE 1.5J.035X260 (WIRE) IMPLANT
INQWIRE 1.5J .035X260CM (WIRE) ×1
KIT HEART LEFT (KITS) ×1 IMPLANT
PACK CARDIAC CATHETERIZATION (CUSTOM PROCEDURE TRAY) ×1 IMPLANT
TRANSDUCER W/STOPCOCK (MISCELLANEOUS) ×1 IMPLANT
TUBING CIL FLEX 10 FLL-RA (TUBING) ×1 IMPLANT

## 2021-12-19 NOTE — Interval H&P Note (Signed)
Cath Lab Visit (complete for each Cath Lab visit)  Clinical Evaluation Leading to the Procedure:   ACS: Yes.    Non-ACS:    Anginal Classification: CCS IV  Anti-ischemic medical therapy: Minimal Therapy (1 class of medications)  Non-Invasive Test Results: No non-invasive testing performed  Prior CABG: No previous CABG      History and Physical Interval Note:  12/19/2021 2:06 PM  Mary Vance  has presented today for surgery, with the diagnosis of nstemi.  The various methods of treatment have been discussed with the patient and family. After consideration of risks, benefits and other options for treatment, the patient has consented to  Procedure(s): LEFT HEART CATH AND CORONARY ANGIOGRAPHY (N/A) as a surgical intervention.  The patient's history has been reviewed, patient examined, no change in status, stable for surgery.  I have reviewed the patient's chart and labs.  Questions were answered to the patient's satisfaction.     Belva Crome III

## 2021-12-19 NOTE — ED Notes (Signed)
Pt sitting up in bed, family at bedside, pt requests that blood pressures are taken on her L arm, informed pt that she had an arm restriction, pt states that her surgery was 20 years ago and in the past she had a phlebitis in her R arm with the iv and blood pressure cuff.  Pt states that she understands the risks of blood pressures on her L arm and requests blood pressures on L arm.

## 2021-12-19 NOTE — Progress Notes (Signed)
[place] for IV heparin Indication: chest pain/ACS  Allergies  Allergen Reactions   Amoxicillin Swelling    Swelling of throat    Avelox [Moxifloxacin] Other (See Comments)    "Deathly sick"    Celexa [Citalopram Hydrobromide] Other (See Comments)    Nightmares    Codeine Other (See Comments)    Gi upset    Doxycycline Other (See Comments)    Reflux    Keflex [Cephalexin] Other (See Comments)    Unknown    Penicillins Other (See Comments)    Childhood reaction. "About died when I was a baby"     Patient Measurements: Height: '5\' 6"'$  (167.6 cm) Weight: 56.7 kg (125 lb) IBW/kg (Calculated) : 59.3 Heparin Dosing Weight: TBW  Vital Signs: Temp: 97.7 F (36.5 C) (10/10 1422) Temp Source: Oral (10/10 1422) BP: 157/87 (10/10 1717) Pulse Rate: 70 (10/10 1750)  Labs: Recent Labs    12/18/21 1035 12/18/21 1118 12/18/21 1304 12/18/21 1903 12/18/21 2241 12/19/21 0618  HGB  --  14.9  --   --   --  14.7  HCT  --  44.8  --   --   --  43.7  PLT  --  PLATELET CLUMPS NOTED ON SMEAR, UNABLE TO ESTIMATE  --   --   --  164  HEPARINUNFRC  --   --   --   --  0.45 0.39  CREATININE 0.87  --   --   --   --  0.77  TROPONINIHS 203*  --  596* 866*  --   --      Estimated Creatinine Clearance: 65.3 mL/min (by C-G formula based on SCr of 0.77 mg/dL).   Medical History: Past Medical History:  Diagnosis Date   Breast cancer ([place]) 2003   Lt breast   Cancer ([place])     Medications:  Medications Prior to Admission  Medication Sig Dispense Refill Last Dose   acetaminophen (TYLENOL) 500 MG tablet Take 1,000 mg by mouth 2 (two) times daily as needed (pain).   12/16/2021   ALPRAZolam (XANAX) 1 MG tablet Take 0.5 mg by mouth daily as needed for anxiety or sleep.   12/18/2021   cetirizine (ZYRTEC) 10 MG tablet Take 10 mg by mouth daily.   12/17/2021   clobetasol ointment (TEMOVATE) 9.67 % Apply 1 Application topically 3 (three) times a week.   Past Week    famotidine (PEPCID) 20 MG tablet Take 20 mg by mouth 2 (two) times daily.   12/17/2021   Multiple Vitamins-Minerals (MULTI ADULT GUMMIES) CHEW Chew 2 tablets by mouth daily.   Past Week   Probiotic Product (PROBIOTIC GUMMIES PO) Take 2 tablets by mouth daily.   Past Week   SALINE NASAL SPRAY NA Place 1 spray into the nose daily as needed (headache/dryness).   2-3 weeks   Scheduled:   aspirin  81 mg Oral Daily   [START ON 12/20/2021] atorvastatin  80 mg Oral Daily   [START ON 12/20/2021] clopidogrel  75 mg Oral Q breakfast   famotidine  20 mg Oral BID   losartan  25 mg Oral Daily   metoprolol succinate  50 mg Oral Daily   sodium chloride flush  3 mL Intravenous Q12H   PRN: sodium chloride, acetaminophen, ALPRAZolam, hydrALAZINE, labetalol, levalbuterol, morphine injection, nitroGLYCERIN, ondansetron (ZOFRAN) IV, ondansetron, sodium chloride flush  Assessment: 39 yoF with no PMH in Epic presents with L-sided chest pain radiating to arm x 2 hr, nausea. Troponins  elevated x 1. Pharmacy consulted to dose IV heparin for ACS.  Heparin off this afternoon for cath lab.  Pharmacy asked to resume heparin 2 hrs after TR band was removed.  Band off at 2020 PM.   Goal of Therapy: Heparin level 0.3-0.7 units/ml Monitor platelets by anticoagulation protocol: Yes  Plan: Resume IV heparin at 750 units/hr at 2220 PM tonight. Check heparin level 6 hrs after gtt starts. Daily heparin level and CBC.  [place] Crane, Roylene Reason, BCCP Clinical Pharmacist  12/19/2021 9:03 PM   [facility] pharmacy phone numbers are listed on [email or web address]

## 2021-12-19 NOTE — Progress Notes (Addendum)
[place] for IV heparin Indication: chest pain/ACS  Allergies  Allergen Reactions   Amoxicillin Swelling    Swelling of throat    Avelox [Moxifloxacin] Other (See Comments)    "Deathly sick"    Celexa [Citalopram Hydrobromide] Other (See Comments)    Nightmares    Codeine Other (See Comments)    Gi upset    Doxycycline Other (See Comments)    Reflux    Keflex [Cephalexin] Other (See Comments)    Unknown    Penicillins Other (See Comments)    Childhood reaction. "About died when I was a baby"     Patient Measurements: Height: '5\' 6"'$  (167.6 cm) Weight: 59 kg (130 lb) IBW/kg (Calculated) : 59.3 Heparin Dosing Weight: TBW  Vital Signs: Temp: 97.8 F (36.6 C) (10/09 2100) Temp Source: Oral (10/09 1420) BP: 160/105 (10/09 2100) Pulse Rate: 74 (10/09 2100)  Labs: Recent Labs    12/18/21 1035 12/18/21 1118 12/18/21 1304 12/18/21 1903 12/18/21 2241  HGB  --  14.9  --   --   --   HCT  --  44.8  --   --   --   PLT  --  PLATELET CLUMPS NOTED ON SMEAR, UNABLE TO ESTIMATE  --   --   --   HEPARINUNFRC  --   --   --   --  0.45  CREATININE 0.87  --   --   --   --   TROPONINIHS 203*  --  596* 866*  --      Estimated Creatinine Clearance: 62.4 mL/min (by C-G formula based on SCr of 0.87 mg/dL).   Medical History: Past Medical History:  Diagnosis Date   Breast cancer ([place]) 2003   Lt breast   Cancer ([place])     Medications:  (Not in a hospital admission) Scheduled:   aspirin  81 mg Oral Daily   atorvastatin  80 mg Oral Daily   metoprolol tartrate  25 mg Oral BID   PRN: morphine injection, nitroGLYCERIN  Assessment: 15 yoF with no PMH in Epic presents with L-sided chest pain radiating to arm x 2 hr, nausea. Troponins elevated x 1. Pharmacy consulted to dose IV heparin for ACS.  Baseline INR, aPTT: not done Prior anticoagulation: none  Significant events:  Today, 12/19/2021: Heparin level = 0.45 (therapeutic) with heparin  gtt @ 750 units/hr No bleeding issues per nursing   Goal of Therapy: Heparin level 0.3-0.7 units/ml Monitor platelets by anticoagulation protocol: Yes  Plan: Continue Heparin gtt @ 750 units/hr Recheck heparin level with AM labs to confirm therapeutic dose Daily CBC, daily heparin level once stable Monitor for signs of bleeding or thrombosis  Leone Haven, PharmD 12/19/2021, 12:05 AM  ADDENDUM: Heparin level = 0.39 (therapeutic) with heparin @ 750 units/hr CBC wnl No complications of therapy noted  Plan: Continue heparin gtt @ 750 units/hr Daily heparin level & CBC Monitor for signs of bleeding  Leone Haven, PharmD 12/19/2021 @ 06:50

## 2021-12-19 NOTE — ED Notes (Signed)
Pt ambulatory to restroom w/out assistance.  

## 2021-12-19 NOTE — CV Procedure (Addendum)
50% mid circumflex after origin of the first obtuse marginal and proximal to the large second obtuse marginal.  Image by image assessments suggests the possibility of haziness/? Thrombus.  TIMI grade III flow was noted. Otherwise coronaries are clean  Possible that she had occlusion with recanalization of the mid circumflex.  The greater than 12 hours of chest discomfort could have been due to microembolization in that territory.  Recommend medical therapy at this point.  If recurrent chest pain, consider stenting the mid circumflex.  High intensity statin therapy, aspirin and Plavix, and will DC IV nitroglycerin and Heparin in AM.  She needs close follow-up and perhaps another day in the hospital on oral therapy to be certain that she will not have recurrent chest discomfort.  I doubt this is atypical Takotsubo although the circumstances leading to the chest pain are suspicious.

## 2021-12-19 NOTE — ED Notes (Signed)
Pt sitting up in bed, pt offers no complaints at this time, pt denies pain, care link at bedside, report to care link, called cath lab to give report.  Report to rn Maleficent, pt from dpt.

## 2021-12-19 NOTE — ED Notes (Signed)
Pt. States that she does not feel that Ishe needs to have her nitro increased because she isn't hurting "like she was before."

## 2021-12-19 NOTE — ED Notes (Signed)
Called cath lab, per cath lab room is not available at this time, states that they will call when bed is available and they will organize transport.

## 2021-12-19 NOTE — ED Notes (Signed)
Pt sitting up in bed, pt denies pain, resps even and unlabored, lungs clear.  Pt answering questions appropriately.

## 2021-12-19 NOTE — H&P (Addendum)
Cardiology Admission History and Physical   Patient ID: Mary Vance MRN: [id number]; DOB: 06-06-1960   Admission date: 12/18/2021  PCP:  Mary Lass, MD   [facility] Cardiologist:  New to [facility]     Chief Complaint:  Chest pain  Patient Profile:   Mary Vance is a 61 y.o. female with L breast cancer s/p lumpectomy 20 years ago and history of asthma who is being seen 12/19/2021 for the evaluation of NSTEMI.  History of Present Illness:   Ms. Mary Vance is a 61 year old female with past medical history of L breast cancer s/p lumpectomy 20 years ago and history of asthma.  She did have oophorectomy for premenopausal breast cancer.  Patient has significant family history of heart issue.  Mother had MI at age 47.  She has 2 maternal uncles and 1 maternal aunt who underwent open heart surgery.  She herself has never been diagnosed with MI or stroke in the past.  She does occasionally do marijuana but no other illicit drug.  She says her cholesterol is borderline high however not severe enough to be placed on medication.  Patient was in her usual state of health until 12/18/2021.  She parked her car in the parking deck and was walking [place] the hill to the court house when she started noticing exertional chest pain.  She stopped the multiple times en route to the court house.  By the time she got to the court house, her chest pain has eased off.  She eventually sought medical attention at [facility].  On arrival, she was hypertensive with blood pressure of 171/89.  Serial high-sensitivity troponin were elevated at 203-->596-->866.  Renal function and electrolyte were normal.  Red blood cell count also normal.  EKG shows normal sinus rhythm with no acute changes.  Patient was placed on IV heparin and IV nitroglycerin.  Serial EKG was obtained overnight, last EKG obtained in the morning of 12/19/2021 does show new T wave inversion in the inferior leads.  Cardiology service  asked to admit for NSTEMI.   Past Medical History:  Diagnosis Date   Breast cancer [facility]) 2003   Lt breast   Cancer [facility])     Past Surgical History:  Procedure Laterality Date   BACK SURGERY  1994   BREAST LUMPECTOMY Left 2003   OOPHORECTOMY N/A      Medications Prior to Admission: Prior to Admission medications   Medication Sig Start Date End Date Taking? Authorizing Provider  acetaminophen (TYLENOL) 500 MG tablet Take 1,000 mg by mouth 2 (two) times daily as needed (pain).   Yes [provider]  ALPRAZolam Mary Vance) 1 MG tablet Take 0.5 mg by mouth daily as needed for anxiety or sleep. 09/25/21  Yes [provider]  cetirizine (ZYRTEC) 10 MG tablet Take 10 mg by mouth daily.   Yes [provider]  clobetasol ointment (TEMOVATE) 6.57 % Apply 1 Application topically 3 (three) times a week. 09/25/21  Yes [provider]  famotidine (PEPCID) 20 MG tablet Take 20 mg by mouth 2 (two) times daily.   Yes [provider]  Multiple Vitamins-Minerals (MULTI ADULT GUMMIES) CHEW Chew 2 tablets by mouth daily.   Yes [provider]  Probiotic Product (PROBIOTIC GUMMIES PO) Take 2 tablets by mouth daily.   Yes [provider]  SALINE NASAL SPRAY NA Place 1 spray into the nose daily as needed (headache/dryness).   Yes [provider]  Allergies:    Allergies  Allergen Reactions   Amoxicillin Swelling    Swelling of throat    Avelox [Moxifloxacin] Other (See Comments)    "Deathly sick"    Celexa [Citalopram Hydrobromide] Other (See Comments)    Nightmares    Codeine Other (See Comments)    Gi upset    Doxycycline Other (See Comments)    Reflux    Keflex [Cephalexin] Other (See Comments)    Unknown    Penicillins Other (See Comments)    Childhood reaction. "About died when I was a baby"     Social History:   Social History   Socioeconomic History   Marital status: Unknown    Spouse name: Not on file   Number  of children: Not on file   Years of education: Not on file   Highest education level: Not on file  Occupational History   Not on file  Tobacco Use   Smoking status: Former    Types: Cigarettes   Smokeless tobacco: Never   Tobacco comments:    Quit around 2000 after breast cancer diagnosis  Substance and Sexual Activity   Alcohol use: Not Currently   Drug use: Yes    Types: Marijuana   Sexual activity: Not on file  Other Topics Concern   Not on file  Social History Narrative   Not on file   Social Determinants of Health   Financial Resource Strain: Not on file  Food Insecurity: Not on file  Transportation Needs: Not on file  Physical Activity: Not on file  Stress: Not on file  Social Connections: Not on file  Intimate Partner Violence: Not on file    Family History:   The patient's family history includes Alzheimer's disease in her father; CAD in her maternal aunt and maternal uncle; Heart attack in her mother; Hyperlipidemia in her mother; Lung cancer in her mother.    ROS:  Please see the history of present illness.  All other ROS reviewed and negative.     Physical Exam/Data:   Vitals:   12/19/21 0545 12/19/21 0614 12/19/21 0741 12/19/21 0753  BP: (!) 150/96  (!) 152/103 (!) 165/101  Pulse: 75  74 64  Resp: '16  14 16  '$ Temp:  98.1 F (36.7 C) 98.2 F (36.8 C) 98.5 F (36.9 C)  TempSrc:  Oral Axillary Oral  SpO2: 98%  100% 100%  Weight:      Height:        Intake/Output Summary (Last 24 hours) at 12/19/2021 0857 Last data filed at 12/19/2021 0304 Gross per 24 hour  Intake 15.42 ml  Output --  Net 15.42 ml      12/18/2021   10:06 AM  Last 3 Weights  Weight (lbs) 130 lb  Weight (kg) 58.968 kg     Body mass index is 20.98 kg/m.  General:  Well nourished, well developed, in no acute distress HEENT: normal Neck: no JVD Vascular: No carotid bruits; Distal pulses 2+ bilaterally   Cardiac:  normal S1, S2; RRR; no murmur  Lungs:  clear to auscultation  bilaterally, no wheezing, rhonchi or rales  Abd: soft, nontender, no hepatomegaly  Ext: no edema Musculoskeletal:  No deformities, BUE and BLE strength normal and equal Skin: warm and dry  Neuro:  CNs 2-12 intact, no focal abnormalities noted Psych:  Normal affect    EKG:  The ECG that was done and was personally reviewed and demonstrates normal sinus rhythm without ST-T wave changes on 12/18/2021,  repeat EKG obtained in the morning 12/19/2021 showed sinus rhythm with T wave inversion in the inferior leads which is new.  Relevant CV Studies:  N/A Pending Echo  Laboratory Data:  High Sensitivity Troponin:   Recent Labs  Lab 12/18/21 1035 12/18/21 1304 12/18/21 1903  TROPONINIHS 203* 596* 866*      Chemistry Recent Labs  Lab 12/18/21 1035 12/19/21 0618  NA 142 141  K 3.9 3.9  CL 105 106  CO2 28 28  GLUCOSE 126* 122*  BUN 12 12  CREATININE 0.87 0.77  CALCIUM 9.7 9.3  MG  --  2.1  GFRNONAA >60 >60  ANIONGAP 9 7    No results for input(s): "PROT", "ALBUMIN", "AST", "ALT", "ALKPHOS", "BILITOT" in the last 168 hours. Lipids  Recent Labs  Lab 12/19/21 0618  CHOL 191  TRIG 70  HDL 52  LDLCALC 125*  CHOLHDL 3.7   Hematology Recent Labs  Lab 12/18/21 1118 12/19/21 0618  WBC 9.1 7.4  RBC 4.70 4.59  HGB 14.9 14.7  HCT 44.8 43.7  MCV 95.3 95.2  MCH 31.7 32.0  MCHC 33.3 33.6  RDW 11.9 12.1  PLT PLATELET CLUMPS NOTED ON SMEAR, UNABLE TO ESTIMATE 164   Thyroid  Recent Labs  Lab 12/19/21 0618  TSH 1.353   BNPNo results for input(s): "BNP", "PROBNP" in the last 168 hours.  DDimer No results for input(s): "DDIMER" in the last 168 hours.   Radiology/Studies:  ECHOCARDIOGRAM COMPLETE  Result Date: 12/19/2021    ECHOCARDIOGRAM REPORT   Patient Name:   SALIYAH GILLIN Date of Exam: 12/19/2021 Medical Rec #:  [id number]      Height:       66.0 in Accession #:    [id number]     Weight:       130.0 lb Date of Birth:  October 11, 1960      BSA:          1.665 m Patient  Age:    66 years       BP:           150/96 mmHg Patient Gender: F              HR:           66 bpm. Exam Location:  Inpatient Procedure: 2D Echo and Intracardiac Opacification Agent Indications:    NSTEMI  History:        Patient has no prior history of Echocardiogram examinations.  Sonographer:    Harvie Junior Referring Phys: [place] Comments: Technically difficult study due to poor echo windows. Image acquisition challenging due to respiratory motion. IMPRESSIONS  1. Left ventricular ejection fraction, by estimation, is 40 to 45%. The left ventricle has mildly decreased function. The left ventricle demonstrates regional wall motion abnormalities (RCA and LCX territory). Left ventricular diastolic parameters are consistent with Grade II diastolic dysfunction (pseudonormalization).  2. Right ventricular systolic function is normal. The right ventricular size is normal. Tricuspid regurgitation signal is inadequate for assessing PA pressure.  3. Mild anterior mitral valve billowing without frank prolapse.. The mitral valve is grossly normal. No evidence of mitral valve regurgitation. No evidence of mitral stenosis.  4. The aortic valve is tricuspid. Aortic valve regurgitation is not visualized. No aortic stenosis is present.  5. The inferior vena cava is normal in size with greater than 50% respiratory variability, suggesting right atrial pressure of 3 mmHg. Comparison(s): No prior Echocardiogram. FINDINGS  Left Ventricle: Left ventricular ejection  fraction, by estimation, is 40 to 45%. The left ventricle has mildly decreased function. The left ventricle demonstrates regional wall motion abnormalities. Definity contrast agent was given IV to delineate the left ventricular endocardial borders. The left ventricular internal cavity size was normal in size. There is no left ventricular hypertrophy. Left ventricular diastolic parameters are consistent with Grade II diastolic dysfunction  (pseudonormalization).  LV Wall Scoring: The antero-lateral wall, entire inferior wall, posterior wall, apical septal segment, and basal inferoseptal segment are hypokinetic. Right Ventricle: The right ventricular size is normal. No increase in right ventricular wall thickness. Right ventricular systolic function is normal. Tricuspid regurgitation signal is inadequate for assessing PA pressure. Left Atrium: Left atrial size was normal in size. Right Atrium: Right atrial size was normal in size. Pericardium: There is no evidence of pericardial effusion. Presence of epicardial fat layer. Mitral Valve: Mild anterior mitral valve billowing without frank prolapse. The mitral valve is grossly normal. No evidence of mitral valve regurgitation. No evidence of mitral valve stenosis. Tricuspid Valve: The tricuspid valve is normal in structure. Tricuspid valve regurgitation is not demonstrated. No evidence of tricuspid stenosis. Aortic Valve: The aortic valve is tricuspid. Aortic valve regurgitation is not visualized. No aortic stenosis is present. Aortic valve mean gradient measures 2.0 mmHg. Aortic valve peak gradient measures 3.6 mmHg. Aortic valve area, by VTI measures 2.22 cm. Pulmonic Valve: The pulmonic valve was normal in structure. Pulmonic valve regurgitation is not visualized. No evidence of pulmonic stenosis. Aorta: The aortic root is normal in size and structure. Venous: The inferior vena cava is normal in size with greater than 50% respiratory variability, suggesting right atrial pressure of 3 mmHg. IAS/Shunts: No atrial level shunt detected by color flow Doppler.  LEFT VENTRICLE PLAX 2D LVIDd:         4.60 cm     Diastology LVIDs:         3.70 cm     LV e' medial:    6.74 cm/s LV PW:         0.90 cm     LV E/e' medial:  14.5 LV IVS:        0.90 cm     LV e' lateral:   6.42 cm/s LVOT diam:     2.00 cm     LV E/e' lateral: 15.2 LV SV:         44 LV SV Index:   26 LVOT Area:     3.14 cm  LV Volumes (MOD) LV vol  d, MOD A2C: 73.6 ml LV vol d, MOD A4C: 96.6 ml LV vol s, MOD A2C: 40.5 ml LV vol s, MOD A4C: 54.9 ml LV SV MOD A2C:     33.1 ml LV SV MOD A4C:     96.6 ml LV SV MOD BP:      39.9 ml RIGHT VENTRICLE RV Basal diam:  3.00 cm RV Mid diam:    2.20 cm RV S prime:     9.79 cm/s TAPSE (M-mode): 1.3 cm LEFT ATRIUM             Index        RIGHT ATRIUM          Index LA diam:        2.60 cm 1.56 cm/m   RA Area:     8.05 cm LA Vol (A2C):   39.4 ml 23.66 ml/m  RA Volume:   17.60 ml 10.57 ml/m LA Vol (A4C):   30.0 ml 18.02  ml/m LA Biplane Vol: 34.2 ml 20.54 ml/m  AORTIC VALVE                    PULMONIC VALVE AV Area (Vmax):    2.26 cm     PV Vmax:       0.76 m/s AV Area (Vmean):   2.07 cm     PV Peak grad:  2.3 mmHg AV Area (VTI):     2.22 cm AV Vmax:           95.00 cm/s AV Vmean:          66.500 cm/s AV VTI:            0.198 m AV Peak Grad:      3.6 mmHg AV Mean Grad:      2.0 mmHg LVOT Vmax:         68.40 cm/s LVOT Vmean:        43.800 cm/s LVOT VTI:          0.140 m LVOT/AV VTI ratio: 0.71  AORTA Ao Root diam: 3.20 cm MITRAL VALVE               TRICUSPID VALVE MV Area (PHT): 5.13 cm    TR Peak grad:   21.7 mmHg MV Decel Time: 148 msec    TR Vmax:        233.00 cm/s MR Peak grad: 21.5 mmHg MR Vmax:      232.00 cm/s  SHUNTS MV E velocity: 97.90 cm/s  Systemic VTI:  0.14 m MV A velocity: 64.00 cm/s  Systemic Diam: 2.00 cm MV E/A ratio:  1.53 Rudean Haskell MD Electronically signed by Rudean Haskell MD Signature Date/Time: 12/19/2021/8:55:31 AM    Final    DG Chest 1 View  Result Date: 12/18/2021 CLINICAL DATA:  Chest pain. EXAM: CHEST  1 VIEW COMPARISON:  Chest x-ray May 10, 2007. FINDINGS: The heart size and mediastinal contours are within normal limits. Both lungs are clear. No visible pleural effusions or pneumothorax. No acute osseous abnormality. IMPRESSION: No active disease. Electronically Signed   By: Margaretha Sheffield M.D.   On: 12/18/2021 11:01     Assessment and Plan:    NSTEMI:  -Started on 12/18/2021 while walking uphill to the court house.  -Serial troponin 202-->596-->866.  Initial EKG showed no ischemic changes, however repeat EKG obtained in the morning of 12/19/2021 showed new T wave inversion in the inferior leads.  -Admit to [facility], on board for cardiac catheterization this afternoon by Dr. Tamala Julian.  Risk and benefit of cardiac catheterization has been explained to the patient, she displayed clear understanding.  At her request, the same risk and benefit discussion has been relayed to her husband over the phone as well.   -Risk and benefit of procedure explained to the patient who display clear understanding and agree to proceed.  Discussed with patient possible procedural risk include bleeding, vascular injury, renal injury, arrythmia, MI, stroke and loss of limb or life.   Ischemic cardiomyopathy:   -Echocardiogram 12/19/2021, EF 40 to 45%, regional wall motion abnormality in the RCA and left circumflex territory, grade 2 DD  -will switch the patient to metoprolol succinate (did not use coreg given active asthma), add ARB afterward with plan to potentially switch to [facility] later.   Hyperlipidemia: Started on high dose Lipitor  Elevated blood pressure: Patient has no known history of hypertension, she was informed by her PCP to monitor her blood pressure and  keep a blood pressure diary for 30 days.  Blood pressure since arrival has been elevated in the 150s to 170s range.  Started on metoprolol tartrate, will continue to uptitrate as needed.  History of left breast cancer s/p lumpectomy 20 years ago  Asthma: xopenex breathing treatment   Risk Assessment/Risk Scores:    TIMI Risk Score for Unstable Angina or Non-ST Elevation MI:   The patient's TIMI risk score is 2, which indicates a 8% risk of all cause mortality, new or recurrent myocardial infarction or need for urgent revascularization in the next 14 days.       Severity of  Illness: The appropriate patient status for this patient is OBSERVATION. Observation status is judged to be reasonable and necessary in order to provide the required intensity of service to ensure the patient's safety. The patient's presenting symptoms, physical exam findings, and initial radiographic and laboratory data in the context of their medical condition is felt to place them at decreased risk for further clinical deterioration. Furthermore, it is anticipated that the patient will be medically stable for discharge from the hospital within 2 midnights of admission.    For questions or updates, please contact [place] Please consult [email or web address] for contact info under     Signed, Almyra Deforest, [place]  12/19/2021 8:57 AM

## 2021-12-19 NOTE — Progress Notes (Signed)
  Echocardiogram 2D Echocardiogram has been performed.  Mary Vance 12/19/2021, 8:48 AM

## 2021-12-20 ENCOUNTER — Encounter (HOSPITAL_COMMUNITY): Payer: Self-pay | Admitting: Interventional Cardiology

## 2021-12-20 LAB — CBC
HCT: 43.3 % (ref 36.0–46.0)
Hemoglobin: 14.4 g/dL (ref 12.0–15.0)
MCH: 31.6 pg (ref 26.0–34.0)
MCHC: 33.3 g/dL (ref 30.0–36.0)
MCV: 95 fL (ref 80.0–100.0)
Platelets: ADEQUATE 10*3/uL (ref 150–400)
RBC: 4.56 MIL/uL (ref 3.87–5.11)
RDW: 12.1 % (ref 11.5–15.5)
WBC: 6.4 10*3/uL (ref 4.0–10.5)
nRBC: 0 % (ref 0.0–0.2)

## 2021-12-20 LAB — BASIC METABOLIC PANEL
Anion gap: 10 (ref 5–15)
BUN: 9 mg/dL (ref 8–23)
CO2: 26 mmol/L (ref 22–32)
Calcium: 9 mg/dL (ref 8.9–10.3)
Chloride: 106 mmol/L (ref 98–111)
Creatinine, Ser: 0.93 mg/dL (ref 0.44–1.00)
GFR, Estimated: >60 mL/min (ref >60)
Glucose, Bld: 92 mg/dL (ref 70–99)
Potassium: 3.5 mmol/L (ref 3.5–5.1)
Sodium: 142 mmol/L (ref 135–145)

## 2021-12-20 LAB — TROPONIN I (HIGH SENSITIVITY): Troponin I (High Sensitivity): 430 ng/L (ref <18)

## 2021-12-20 LAB — BRAIN NATRIURETIC PEPTIDE: B Natriuretic Peptide: 450.9 pg/mL — ABNORMAL HIGH (ref 0.0–100.0)

## 2021-12-20 MED ORDER — LOSARTAN POTASSIUM 50 MG PO TABS
50.0000 mg | ORAL_TABLET | Freq: Every day | ORAL | Status: DC
  Administered 2021-12-21: 50 mg via ORAL
  Filled 2021-12-20: qty 1

## 2021-12-20 MED ORDER — ENOXAPARIN SODIUM 40 MG/0.4ML IJ SOSY
40.0000 mg | PREFILLED_SYRINGE | INTRAMUSCULAR | Status: DC
  Administered 2021-12-20: 40 mg via SUBCUTANEOUS
  Filled 2021-12-20 (×2): qty 0.4

## 2021-12-20 MED ORDER — LORATADINE 10 MG PO TABS
10.0000 mg | ORAL_TABLET | Freq: Every day | ORAL | Status: DC
  Administered 2021-12-20 – 2021-12-21 (×2): 10 mg via ORAL
  Filled 2021-12-20 (×2): qty 1

## 2021-12-20 MED ORDER — SPIRONOLACTONE 12.5 MG HALF TABLET
12.5000 mg | ORAL_TABLET | Freq: Every day | ORAL | Status: DC
  Administered 2021-12-20 – 2021-12-21 (×2): 12.5 mg via ORAL
  Filled 2021-12-20 (×2): qty 1

## 2021-12-20 NOTE — Progress Notes (Signed)
Cardiology Progress Note  Patient ID: Mary Vance MRN: [id number] DOB: [birth year] Date of Encounter: 12/20/2021  Primary Cardiologist: None  Subjective   Chief Complaint: None.   HPI: No targets for revascularization yesterday.  Symptoms of chest pain have resolved.  Interventional cardiology believes she may have had disease in the circumflex that recannulized with medical therapy.  Telemetry stable.  ROS:  All other ROS reviewed and negative. Pertinent positives noted in the HPI.     Inpatient Medications  Scheduled Meds:  aspirin  81 mg Oral Daily   atorvastatin  80 mg Oral Daily   clopidogrel  75 mg Oral Q breakfast   famotidine  20 mg Oral BID   losartan  25 mg Oral Daily   metoprolol succinate  50 mg Oral Daily   sodium chloride flush  3 mL Intravenous Q12H   Continuous Infusions:  sodium chloride 50 mL/hr at 12/19/21 0924   sodium chloride     PRN Meds: sodium chloride, acetaminophen, ALPRAZolam, levalbuterol, morphine injection, nitroGLYCERIN, ondansetron (ZOFRAN) IV, ondansetron, sodium chloride flush   Vital Signs   Vitals:   12/19/21 2000 12/19/21 2135 12/20/21 0215 12/20/21 0816  BP:  117/79 (!) 144/82 (!) 145/90  Pulse:  71  68  Resp:      Temp:  98.5 F (36.9 C) 98.8 F (37.1 C)   TempSrc:  Oral Oral   SpO2:  96% 98%   Weight: 56.7 kg     Height: '5\' 6"'$  (1.676 m)      No intake or output data in the 24 hours ending 12/20/21 0945    12/19/2021    8:00 PM 12/18/2021   10:06 AM  Last 3 Weights  Weight (lbs) 125 lb 130 lb  Weight (kg) 56.7 kg 58.968 kg      Telemetry  Overnight telemetry shows sinus rhythm in the 70s, which I personally reviewed.   ECG  The most recent ECG shows sinus rhythm, inferolateral T wave inversions, which I personally reviewed.   Physical Exam   Vitals:   12/19/21 2000 12/19/21 2135 12/20/21 0215 12/20/21 0816  BP:  117/79 (!) 144/82 (!) 145/90  Pulse:  71  68  Resp:      Temp:  98.5 F (36.9 C) 98.8 F  (37.1 C)   TempSrc:  Oral Oral   SpO2:  96% 98%   Weight: 56.7 kg     Height: '5\' 6"'$  (1.676 m)      No intake or output data in the 24 hours ending 12/20/21 0945     12/19/2021    8:00 PM 12/18/2021   10:06 AM  Last 3 Weights  Weight (lbs) 125 lb 130 lb  Weight (kg) 56.7 kg 58.968 kg    Body mass index is 20.18 kg/m.  General: Well nourished, well developed, in no acute distress Head: Atraumatic, normal size  Eyes: PEERLA, EOMI  Neck: Supple, no JVD Endocrine: No thryomegaly Cardiac: Normal S1, S2; RRR; no murmurs, rubs, or gallops Lungs: Clear to auscultation bilaterally, no wheezing, rhonchi or rales  Abd: Soft, nontender, no hepatomegaly  Ext: No edema, pulses 2+, right radial cath site clean and dry, 2+ pulse Musculoskeletal: No deformities, BUE and BLE strength normal and equal Skin: Warm and dry, no rashes   Neuro: Alert and oriented to person, place, time, and situation, CNII-XII grossly intact, no focal deficits  Psych: Normal mood and affect   Labs  High Sensitivity Troponin:   Recent Labs  Lab 12/18/21 1035  12/18/21 1304 12/18/21 1903 12/20/21 0753  TROPONINIHS 203* 596* 866* 430*     Cardiac EnzymesNo results for input(s): "TROPONINI" in the last 168 hours. No results for input(s): "TROPIPOC" in the last 168 hours.  Chemistry Recent Labs  Lab 12/18/21 1035 12/19/21 0618 12/20/21 0753  NA 142 141 142  K 3.9 3.9 3.5  CL 105 106 106  CO2 '28 28 26  '$ GLUCOSE 126* 122* 92  BUN '12 12 9  '$ CREATININE 0.87 0.77 0.93  CALCIUM 9.7 9.3 9.0  GFRNONAA >60 >60 >60  ANIONGAP '9 7 10    '$ Hematology Recent Labs  Lab 12/18/21 1118 12/19/21 0618  WBC 9.1 7.4  RBC 4.70 4.59  HGB 14.9 14.7  HCT 44.8 43.7  MCV 95.3 95.2  MCH 31.7 32.0  MCHC 33.3 33.6  RDW 11.9 12.1  PLT PLATELET CLUMPS NOTED ON SMEAR, UNABLE TO ESTIMATE 164   BNPNo results for input(s): "BNP", "PROBNP" in the last 168 hours.  DDimer No results for input(s): "DDIMER" in the last 168 hours.    Radiology  CARDIAC CATHETERIZATION  Result Date: 12/19/2021 CONCLUSIONS: 50 to 60% mid circumflex lesion suggesting that she could have closed and recanalized the circumflex to account for her presentation. Distal LAD after large diagonal contains 40% narrowing. Tandem 20 to 30% stenoses in the mid right coronary Ostial left main less than 25% Wall motion abnormality involving the inferobasal and posterior segment.  EF is in the 40 to 50% range.  EDP is 15. RECOMMENDATIONS: High intensity statin therapy Dual antiplatelet with aspirin and Plavix therapy for at least 6 months then drop aspirin.  She was given a 300 mg load in the Cath Lab. IV nitroglycerin overnight then DC in a.m. DC IV heparin in a.m. If recurrent similar chest pain, would need relook and potentially PCI of the circumflex. There is an outside chance that the circumflex stenosis is an innocent bystander and that she has an atypical Takotsubo syndrome occurring under stressful circumstances (going to court for jury duty against her desire).   ECHOCARDIOGRAM COMPLETE  Result Date: 12/19/2021    ECHOCARDIOGRAM REPORT   Patient Name:   [name] Date of Exam: 12/19/2021 Medical Rec #:  [id number]      Height:       66.0 in Accession #:    [id number]     Weight:       130.0 lb Date of Birth:  Jul 21, 1960      BSA:          1.665 m Patient Age:    61 years       BP:           150/96 mmHg Patient Gender: F              HR:           66 bpm. Exam Location:  Inpatient Procedure: 2D Echo and Intracardiac Opacification Agent Indications:    NSTEMI  History:        Patient has no prior history of Echocardiogram examinations.  Sonographer:    Harvie Junior Referring Phys: [place] Comments: Technically difficult study due to poor echo windows. Image acquisition challenging due to respiratory motion. IMPRESSIONS  1. Left ventricular ejection fraction, by estimation, is 40 to 45%. The left ventricle has mildly decreased  function. The left ventricle demonstrates regional wall motion abnormalities (RCA and LCX territory). Left ventricular diastolic parameters are consistent with Grade II diastolic dysfunction (pseudonormalization).  2. Right ventricular systolic function is normal. The right ventricular size is normal. Tricuspid regurgitation signal is inadequate for assessing PA pressure.  3. Mild anterior mitral valve billowing without frank prolapse.. The mitral valve is grossly normal. No evidence of mitral valve regurgitation. No evidence of mitral stenosis.  4. The aortic valve is tricuspid. Aortic valve regurgitation is not visualized. No aortic stenosis is present.  5. The inferior vena cava is normal in size with greater than 50% respiratory variability, suggesting right atrial pressure of 3 mmHg. Comparison(s): No prior Echocardiogram. FINDINGS  Left Ventricle: Left ventricular ejection fraction, by estimation, is 40 to 45%. The left ventricle has mildly decreased function. The left ventricle demonstrates regional wall motion abnormalities. Definity contrast agent was given IV to delineate the left ventricular endocardial borders. The left ventricular internal cavity size was normal in size. There is no left ventricular hypertrophy. Left ventricular diastolic parameters are consistent with Grade II diastolic dysfunction (pseudonormalization).  LV Wall Scoring: The antero-lateral wall, entire inferior wall, posterior wall, apical septal segment, and basal inferoseptal segment are hypokinetic. Right Ventricle: The right ventricular size is normal. No increase in right ventricular wall thickness. Right ventricular systolic function is normal. Tricuspid regurgitation signal is inadequate for assessing PA pressure. Left Atrium: Left atrial size was normal in size. Right Atrium: Right atrial size was normal in size. Pericardium: There is no evidence of pericardial effusion. Presence of epicardial fat layer. Mitral Valve: Mild  anterior mitral valve billowing without frank prolapse. The mitral valve is grossly normal. No evidence of mitral valve regurgitation. No evidence of mitral valve stenosis. Tricuspid Valve: The tricuspid valve is normal in structure. Tricuspid valve regurgitation is not demonstrated. No evidence of tricuspid stenosis. Aortic Valve: The aortic valve is tricuspid. Aortic valve regurgitation is not visualized. No aortic stenosis is present. Aortic valve mean gradient measures 2.0 mmHg. Aortic valve peak gradient measures 3.6 mmHg. Aortic valve area, by VTI measures 2.22 cm. Pulmonic Valve: The pulmonic valve was normal in structure. Pulmonic valve regurgitation is not visualized. No evidence of pulmonic stenosis. Aorta: The aortic root is normal in size and structure. Venous: The inferior vena cava is normal in size with greater than 50% respiratory variability, suggesting right atrial pressure of 3 mmHg. IAS/Shunts: No atrial level shunt detected by color flow Doppler.  LEFT VENTRICLE PLAX 2D LVIDd:         4.60 cm     Diastology LVIDs:         3.70 cm     LV e' medial:    6.74 cm/s LV PW:         0.90 cm     LV E/e' medial:  14.5 LV IVS:        0.90 cm     LV e' lateral:   6.42 cm/s LVOT diam:     2.00 cm     LV E/e' lateral: 15.2 LV SV:         44 LV SV Index:   26 LVOT Area:     3.14 cm  LV Volumes (MOD) LV vol d, MOD A2C: 73.6 ml LV vol d, MOD A4C: 96.6 ml LV vol s, MOD A2C: 40.5 ml LV vol s, MOD A4C: 54.9 ml LV SV MOD A2C:     33.1 ml LV SV MOD A4C:     96.6 ml LV SV MOD BP:      39.9 ml RIGHT VENTRICLE RV Basal diam:  3.00 cm RV Mid diam:  2.20 cm RV S prime:     9.79 cm/s TAPSE (M-mode): 1.3 cm LEFT ATRIUM             Index        RIGHT ATRIUM          Index LA diam:        2.60 cm 1.56 cm/m   RA Area:     8.05 cm LA Vol (A2C):   39.4 ml 23.66 ml/m  RA Volume:   17.60 ml 10.57 ml/m LA Vol (A4C):   30.0 ml 18.02 ml/m LA Biplane Vol: 34.2 ml 20.54 ml/m  AORTIC VALVE                    PULMONIC VALVE AV  Area (Vmax):    2.26 cm     PV Vmax:       0.76 m/s AV Area (Vmean):   2.07 cm     PV Peak grad:  2.3 mmHg AV Area (VTI):     2.22 cm AV Vmax:           95.00 cm/s AV Vmean:          66.500 cm/s AV VTI:            0.198 m AV Peak Grad:      3.6 mmHg AV Mean Grad:      2.0 mmHg LVOT Vmax:         68.40 cm/s LVOT Vmean:        43.800 cm/s LVOT VTI:          0.140 m LVOT/AV VTI ratio: 0.71  AORTA Ao Root diam: 3.20 cm MITRAL VALVE               TRICUSPID VALVE MV Area (PHT): 5.13 cm    TR Peak grad:   21.7 mmHg MV Decel Time: 148 msec    TR Vmax:        233.00 cm/s MR Peak grad: 21.5 mmHg MR Vmax:      232.00 cm/s  SHUNTS MV E velocity: 97.90 cm/s  Systemic VTI:  0.14 m MV A velocity: 64.00 cm/s  Systemic Diam: 2.00 cm MV E/A ratio:  1.53 Rudean Haskell MD Electronically signed by Rudean Haskell MD Signature Date/Time: 12/19/2021/8:55:31 AM    Final    DG Chest 1 View  Result Date: 12/18/2021 CLINICAL DATA:  Chest pain. EXAM: CHEST  1 VIEW COMPARISON:  Chest x-ray May 10, 2007. FINDINGS: The heart size and mediastinal contours are within normal limits. Both lungs are clear. No visible pleural effusions or pneumothorax. No acute osseous abnormality. IMPRESSION: No active disease. Electronically Signed   By: Margaretha Sheffield M.D.   On: 12/18/2021 11:01    Cardiac Studies  TTE 12/19/2021  1. Left ventricular ejection fraction, by estimation, is 40 to 45%. The  left ventricle has mildly decreased function. The left ventricle  demonstrates regional wall motion abnormalities (RCA and LCX territory).  Left ventricular diastolic parameters are  consistent with Grade II diastolic dysfunction (pseudonormalization).   2. Right ventricular systolic function is normal. The right ventricular  size is normal. Tricuspid regurgitation signal is inadequate for assessing  PA pressure.   3. Mild anterior mitral valve billowing without frank prolapse.. The  mitral valve is grossly normal. No evidence  of mitral valve regurgitation.  No evidence of mitral stenosis.   4. The aortic valve is tricuspid. Aortic valve regurgitation is not  visualized. No aortic stenosis  is present.   5. The inferior vena cava is normal in size with greater than 50%  respiratory variability, suggesting right atrial pressure of 3 mmHg.   LHC 12/19/2021 CONCLUSIONS: 50 to 60% mid circumflex lesion suggesting that she could have closed and recanalized the circumflex to account for her presentation. Distal LAD after large diagonal contains 40% narrowing. Tandem 20 to 30% stenoses in the mid right coronary Ostial left main less than 25% Wall motion abnormality involving the inferobasal and posterior segment.  EF is in the 40 to 50% range.  EDP is 15.   RECOMMENDATIONS: High intensity statin therapy Dual antiplatelet with aspirin and Plavix therapy for at least 6 months then drop aspirin.  She was given a 300 mg load in the Cath Lab. IV nitroglycerin overnight then DC in a.m. DC IV heparin in a.m. If recurrent similar chest pain, would need relook and potentially PCI of the circumflex. There is an outside chance that the circumflex stenosis is an innocent bystander and that she has an atypical Takotsubo syndrome occurring under stressful circumstances (going to court for jury duty against her desire).  Patient Profile  61 year old female with history of breast cancer and asthma admitted on 12/19/2021 for non-STEMI. Assessment & Plan   #NSTEMI -Admitted with exertional chest pressure and elevated troponin.  EKG with inferolateral T wave inversions.  Taken to the catheterization laboratory yesterday.  No significant targets for revascularization.  There was a 50 to 60% lesion in the mid circumflex which could have recannulized per discussion with interventional cardiology.  They did not recommend PCI. -Wean heparin.  Wean nitro drip.  Continue aspirin Plavix for 1 year.  There is a small chance she may develop  recurrent symptoms and will need relook left heart catheterization.  She will stay in the hospital today. -Continue high intensity statin. -On metoprolol succinate 50 mg daily.  I have added losartan 50 mg daily as well. -Echo shows posterior wall hypokinesis.  Everything fits with a circumflex infarct but heart catheterization seems unremarkable.  Repeat troponins today.  #Ischemic cardiomyopathy, EF 40 to 45% with regional wall motion abnormalities -EF with evidence of posterior infarct.  See discussion above regarding revascularization. -No clinical evidence of heart failure.  Continue metoprolol succinate 50 mg daily.  Losartan 50 mg daily.  I have added Aldactone 12.5 mg daily.  Need for Lasix.  #Hypertension as above  #Hyperlipidemia -Statin  #Asthma -As needed inhaler  FEN -no IVF -code: full -dvt ppx: lovenox -diet: heart healthy   For questions or updates, please contact [place] Please consult [email or web address] for contact info under     Signed, [place] Bells T. Audie Box, MD, [place]  12/20/2021 9:45 AM

## 2021-12-20 NOTE — Progress Notes (Signed)
Mobility Specialist - Progress Note   12/20/21 1016  Mobility  Activity Ambulated with assistance in hallway  Level of Assistance Standby assist, set-up cues, supervision of patient - no hands on  Assistive Device None  Distance Ambulated (ft) 300 ft  Activity Response Tolerated well  Mobility Referral Yes  $Mobility charge 1 Mobility    During mobility:95 HR Post-mobility:76 HR  Pt received in bed and agreeable to mobility. Pt c/o feeling congested and weak during ambulation. Pt returned to EOB with all needs met.  Larey Seat

## 2021-12-20 NOTE — Progress Notes (Signed)
Heart Failure Nurse Navigator Progress Note  PCP: Kathyrn Lass, MD PCP-Cardiologist: None Admission Diagnosis: NSTEMI Admitted from: Mary Vance, multimedia Duty)   Presentation:   Mary Vance presented with chest pain, was at the court house for [employer] and wasn't feeling well, "felt off", started having left side chest pain that radiated down her arm to her fingers,( per patient she has some pain over her left lumpectomy site that is chronic and unchanged, however this pain was not similar to that ) felt short of breath and nauseated, symptoms continued so she called her husband and came to the hospital. BP 171/89, HR 66,aspirin and nitroGlycerin and Morphine were given.  EKG with some diffuse ST depressions without obvious elevation. BP 149/96, HR 75, Troponin 866.   Patient and husband Mary Vance) were educated on the sign and symptoms of heart failure, daily weights, when to call her doctor or go to the ED, Diet/ fluid restrictions, taking all medications as prescribed and attending all medical appointments. Patient and husband verbalized their understanding and are scheduled for a HF TOC appointment on 12/27/2021 @ 10 am.    ECHO/ LVEF: 40-45 % G2DD  Clinical Course: Heart cath: 12/19/21  50 to 60% mid circumflex lesion suggesting that she could have closed and recanalized the circumflex to account for her presentation. Distal LAD after large diagonal contains 40% narrowing. Tandem 20 to 30% stenoses in the mid right coronary Ostial left main less than 25% Wall motion abnormality involving the inferobasal and posterior segment.  EF is in the 40 to 50% range.  EDP is 15 Medication management started    Social History   Socioeconomic History   Marital status: Unknown    Spouse name: Mary Vance   Number of children: 4   Years of education: Not on file   Highest education level: High school graduate  Occupational History    Comment: part time  Tobacco Use   Smoking status: Former     Types: Cigarettes    Quit date: 03/2001    Years since quitting: 20.7   Smokeless tobacco: Never   Tobacco comments:    Quit around 2000 after breast cancer diagnosis  Vaping Use   Vaping Use: Never used  Substance and Sexual Activity   Alcohol use: Not Currently    Comment: socially   Drug use: Yes    Types: Marijuana    Comment: Sunday   Sexual activity: Not on file  Other Topics Concern   Not on file  Social History Narrative   Not on file   Social Determinants of Health   Financial Resource Strain: Low Risk  (12/20/2021)   Overall Financial Resource Strain (CARDIA)    Difficulty of Paying Living Expenses: Not hard at all  Food Insecurity: No Food Insecurity (12/20/2021)   Hunger Vital Sign    Worried About Running Out of Food in the Last Year: Never true    [place] in the Last Year: Never true  Transportation Needs: No Transportation Needs (12/20/2021)   PRAPARE - Hydrologist (Medical): No    Lack of Transportation (Non-Medical): No  Physical Activity: Not on file  Stress: Not on file  Social Connections: Not on file   Education Assessment and Provision:  Detailed education and instructions provided on heart failure disease management including the following:  Signs and symptoms of Heart Failure When to call the physician Importance of daily weights Low sodium diet Fluid restriction Medication  management Anticipated future follow-up appointments  Patient education given on each of the above topics.  Patient acknowledges understanding via teach back method and acceptance of all instructions.  Education Materials:  "Living Better With Heart Failure" Booklet, HF zone tool, & Daily Weight Tracker Tool.  Patient has scale at home: Yes Patient has pill box at home: NA    High Risk Criteria for Readmission and/or Poor Patient Outcomes: Heart failure hospital admissions (last 6 months): 0  No Show rate: 0 Difficult social  situation: No Demonstrates medication adherence: Yes Primary Language: English Literacy level: reading, writing, and comprehension  Barriers of Care:   New CHF medications Diet/ fluid restrictions (patient uses marijuana to help stimulate her appetite} Daily weights  Considerations/Referrals:   Referral made to Heart Failure Pharmacist Stewardship: Yes Referral made to Heart Failure CSW/NCM TOC: No Referral made to Heart & Vascular TOC clinic: Yes, 12/27/2021 @ 10 am  Items for Follow-up on DC/TOC: New HF medications Diet/ fluid restrictions (patient uses marijuana to help stimulate her appetite) Daily weights   Earnestine Leys, BSN, RN Heart Failure Transport planner Only

## 2021-12-20 NOTE — Care Management (Signed)
  Transition of Care [facility]) Screening Note   Patient Details  Name: Mary Vance Date of Birth: [birth year]   Transition of Care [facility]) CM/SW Contact:    Bethena Roys, RN Phone Number: 12/20/2021, 3:18 PM    Transition of Care Department [facility]) has reviewed the patient and no TOC needs have been identified at this time. We will continue to monitor patient advancement through interdisciplinary progression rounds. If new patient transition needs arise, please place a TOC consult.

## 2021-12-21 ENCOUNTER — Encounter: Payer: Self-pay | Admitting: *Deleted

## 2021-12-21 ENCOUNTER — Other Ambulatory Visit (HOSPITAL_COMMUNITY): Payer: Self-pay

## 2021-12-21 DIAGNOSIS — J45909 Unspecified asthma, uncomplicated: Secondary | ICD-10-CM

## 2021-12-21 DIAGNOSIS — Z006 Encounter for examination for normal comparison and control in clinical research program: Secondary | ICD-10-CM

## 2021-12-21 DIAGNOSIS — I255 Ischemic cardiomyopathy: Secondary | ICD-10-CM

## 2021-12-21 LAB — BASIC METABOLIC PANEL
Anion gap: 11 (ref 5–15)
BUN: 12 mg/dL (ref 8–23)
CO2: 28 mmol/L (ref 22–32)
Calcium: 9.6 mg/dL (ref 8.9–10.3)
Chloride: 105 mmol/L (ref 98–111)
Creatinine, Ser: 0.97 mg/dL (ref 0.44–1.00)
GFR, Estimated: >60 mL/min (ref >60)
Glucose, Bld: 101 mg/dL — ABNORMAL HIGH (ref 70–99)
Potassium: 3.7 mmol/L (ref 3.5–5.1)
Sodium: 144 mmol/L (ref 135–145)

## 2021-12-21 LAB — CBC
HCT: 42.6 % (ref 36.0–46.0)
Hemoglobin: 14.8 g/dL (ref 12.0–15.0)
MCH: 32.3 pg (ref 26.0–34.0)
MCHC: 34.7 g/dL (ref 30.0–36.0)
MCV: 93 fL (ref 80.0–100.0)
Platelets: 154 10*3/uL (ref 150–400)
RBC: 4.58 MIL/uL (ref 3.87–5.11)
RDW: 12.1 % (ref 11.5–15.5)
WBC: 6.7 10*3/uL (ref 4.0–10.5)
nRBC: 0 % (ref 0.0–0.2)

## 2021-12-21 LAB — LIPOPROTEIN A (LPA): Lipoprotein (a): 151.3 nmol/L — ABNORMAL HIGH (ref <75.0)

## 2021-12-21 MED ORDER — STUDY - EVOLVE-MI - EVOLOCUMAB (REPATHA) 140 MG/ML SQ INJECTION (PI-STUCKEY)
140.0000 mg | INJECTION | SUBCUTANEOUS | Status: DC
  Administered 2021-12-21: 140 mg via SUBCUTANEOUS
  Filled 2021-12-21: qty 1

## 2021-12-21 MED ORDER — LOSARTAN POTASSIUM 50 MG PO TABS
50.0000 mg | ORAL_TABLET | Freq: Every day | ORAL | 3 refills | Status: DC
  Filled 2021-12-21: qty 90, 90d supply, fill #0

## 2021-12-21 MED ORDER — CLOPIDOGREL BISULFATE 75 MG PO TABS
75.0000 mg | ORAL_TABLET | Freq: Every day | ORAL | 3 refills | Status: DC
  Filled 2021-12-21: qty 90, 90d supply, fill #0

## 2021-12-21 MED ORDER — SPIRONOLACTONE 25 MG PO TABS
12.5000 mg | ORAL_TABLET | Freq: Every day | ORAL | 3 refills | Status: DC
  Filled 2021-12-21: qty 45, 90d supply, fill #0

## 2021-12-21 MED ORDER — NITROGLYCERIN 0.4 MG SL SUBL
0.4000 mg | SUBLINGUAL_TABLET | SUBLINGUAL | 12 refills | Status: DC | PRN
  Filled 2021-12-21: qty 25, 7d supply, fill #0

## 2021-12-21 MED ORDER — STUDY - EVOLVE-MI - EVOLOCUMAB (REPATHA) 140 MG/ML SQ INJECTION (PI-STUCKEY): 140.0000 mg | INJECTION | SUBCUTANEOUS | 0 refills | Status: DC

## 2021-12-21 MED ORDER — LEVALBUTEROL HCL 0.63 MG/3ML IN NEBU
0.6300 mg | INHALATION_SOLUTION | Freq: Three times a day (TID) | RESPIRATORY_TRACT | 12 refills | Status: AC | PRN
  Filled 2021-12-21: qty 75, 9d supply, fill #0

## 2021-12-21 MED ORDER — METOPROLOL SUCCINATE ER 50 MG PO TB24
50.0000 mg | ORAL_TABLET | Freq: Every day | ORAL | 3 refills | Status: DC
  Filled 2021-12-21: qty 90, 90d supply, fill #0

## 2021-12-21 MED ORDER — ATORVASTATIN CALCIUM 80 MG PO TABS
80.0000 mg | ORAL_TABLET | Freq: Every day | ORAL | 3 refills | Status: DC
  Filled 2021-12-21: qty 90, 90d supply, fill #0

## 2021-12-21 MED ORDER — ASPIRIN 81 MG PO CHEW
81.0000 mg | CHEWABLE_TABLET | Freq: Every day | ORAL | 3 refills | Status: AC
  Filled 2021-12-21: qty 90, 90d supply, fill #0

## 2021-12-21 NOTE — Plan of Care (Signed)
  Problem: Activity: Goal: Ability to tolerate increased activity will improve Outcome: Progressing   Problem: Cardiac: Goal: Ability to achieve and maintain adequate cardiovascular perfusion will improve Outcome: Progressing   Problem: Coping: Goal: Level of anxiety will decrease Outcome: Progressing   Problem: Elimination: Goal: Will not experience complications related to urinary retention Outcome: Progressing   Problem: Pain Managment: Goal: General experience of comfort will improve Outcome: Progressing   Problem: Activity: Goal: Ability to return to baseline activity level will improve Outcome: Progressing   Problem: Cardiovascular: Goal: Vascular access site(s) Level 0-1 will be maintained Outcome: Progressing

## 2021-12-21 NOTE — Progress Notes (Signed)
Mobility Specialist - Progress Note   12/21/21 0959  Mobility  Activity Ambulated with assistance in hallway  Level of Assistance Standby assist, set-up cues, supervision of patient - no hands on  Assistive Device None  Distance Ambulated (ft) 300 ft  Activity Response Tolerated well  Mobility Referral Yes  $Mobility charge 1 Mobility   Pt received in bed and agreeable to mobility. Pt c/o slight head fogginess. Pt was returned to bed with all needs met.   Larey Seat

## 2021-12-21 NOTE — Research (Signed)
Protocol # [id number]   Subject ID#   [id number]                            DAY 1 Date:        21-Dec-2021     Initial Study Treatment- Evolocumab Self-administration  Training for Evolocumab Self-Administration Completed?  '[x]'$  YES  '[]'$   NO  Training for Evolocumab Self-Administration Date:   21-Dec-2021  Evolocumab Administered?  '[x]'$  YES  '[]'$   NO  Evolocumab Start Date: 21-Dec-2021  Lot Number of Initial Dose Administered: [id number]  Additional Lot Number Distributed to Subject: [id number]  Additional Lot Number Distributed to Subject: [id number]  Additional Lot Number Distributed to Subject: [id number]  Additional Lot Number Distributed to Subject: [id number]  Additional Lot Number Distributed to Subject: - [id number]

## 2021-12-21 NOTE — Plan of Care (Signed)

## 2021-12-21 NOTE — Research (Addendum)
EVOLVE  Informed Consent   Subject Name: Mary Vance  Subject met inclusion and exclusion criteria.  The informed consent form, study requirements and expectations were reviewed with the subject and questions and concerns were addressed prior to the signing of the consent form.  The subject verbalized understanding of the trial requirements.  The subject agreed to participate in the EVOLVE  trial and signed the informed consent at 08:42 on 12-21-2021.  The informed consent was obtained prior to performance of any protocol-specific procedures for the subject.  A copy of the signed informed consent was given to the subject and a copy was placed in the subject's medical record.   [place] Denario Bagot, Research Coordinator 12/21/21  08:42      Protocol # [id number]   Subject ID# [id number]                           DAY 1 Date:       21-Dec-2021  Randomization:   Geographic Region:    [place]  Randomization Date:     21-Dec-2021             Randomization Time:  08:54     Treatment type Assigned   '[x]'  Treatment                                   '[]'   Control          Protocol # [id number]  Subject Initial ID#: [id number]                             Age in years: 37  *Demographics are found in the [facility] EMR source.   Protocol Version:   Were all Eligibility Criteria Met?  '[x]'  Yes  '[]'   No  Screening Visit Date:               21-Dec-2021  10.2 Targeted Medical History:  Qualifying Acute Myocardial Infarction Event  '[x]'  NSTEMI  '[]'  STEMI  Admission Date For Index Myocardial Infarction:     18-Dec-2021  Initial Management Strategy: '[x]'  Medical Management ONLY     '[]'  Invasive Mgmt (including Lysis)  Diagnostic Coronary Angiography Performed for Index MI Event? '[x]'  Yes '[]'  No   Diagnostic Coronary Angiography Performed For Index Myocardial Infarction Event (Select All That Apply)    '[x]'  Invasive Coronary Angiography         '[]'  Computed Tomography (CT) angiography   Coronary  Revascularization Performed For Index Myocardial Infarction Event?    '[]'  Yes  '[x]'  No  Coronary Revascularization Performed For Index Myocardial Infarction Event (Select All That Apply)  '[]' Percutaneous Coronary Intervention (PCI)                                        '[]'  Coronary Artery Bypass Graft (CABG)                  '[]'  Fibrinolysis  Ejection Fraction Measured?  '[x]'  Yes  '[]'   No  Ejection Fraction (%) _50___  (must be whole number)  Peak Troponin Level For Index Myocardial Infarction Event?  _866_____   Peak Troponin Unit For Index Myocardial Infarction Event?  ng/L Troponin Normal Range Upper Limit    _18__  History of Multi-vessel Coronary Artery Disease (Including defined during index myocardial infarction event)   '[]'  Yes  '[x]'   No  Cardiovascular and Other Medical History History of Acute Myocardial Infarction Prior To Index Event   '[]'  Yes '[x]'  No  History of Coronary Artery Bypass Graft (CABG) '[]'  Yes  '[x]'   No  History of Percutaneous Coronary Intervention (PCI)  '[]'  Yes  '[x]'   No  History Of Hypertension  '[x]'  Yes  '[]'   No  History of Diabetes Mellitus  '[]'  Yes  '[x]'   No  History of Peripheral Artery Disease (Lower Extremity, Carotid Disease)                            '[]'  Yes  '[x]'   No  History of Cerebrovascular Disease   (Prior Stroke, Transient Ischemic Attack (TIA))  '[]'  Yes  '[x]'   No  History of Lower Extremity Revascularization  '[]'  Yes  '[x]'   No  History of Major Lower Extremity Amputation At Or Above The Level Of The Ankle                        '[]'  Yes  '[x]'   No  History of Smoking Tobacco  '[]'  Never Smoked     '[]'  Former Smoker     '[]'  Current Smoker     '[x]'  Unknown  VITAL SIGNS (after Consent is signed) Height   __66_____       '[]'  centimeters  '[x]'   Inches  Weight _125_____  '[]'  Kilograms  '[x]'  Lbs   Systolic Blood Pressure (mmHg)    _147_____  (Most recent Prior to Randomization  Diastolic Blood Pressure (mmHg)    _85____  (Most recent Prior to Randomization)        Protocol # [id number]   Subject Initial ID#      [id number]  10.4 Laboratory Assessments - Baseline Baseline Laboratory Assessments Performed? '[x]'  Yes '[]'   No  Lipid Panel Lipid Panel available?  '[x]'  Yes  '[]'   No Lipid Panel Date?       19-Dec-2021   Total Cholesterol Result?  _191__   Unit: '[x]'   mg/dL   '[]'  mmol/L     High Density Lipoprotein Cholesterol (HDL-C) Result? _52____         Unit: '[x]'   mg/dL   '[]'  mmol/L     Triglycerides?  _70_____   Unit: '[x]'   mg/dL   '[]'  mmol/L               Low Density Lipoprotein Cholesterol (LDL-C) 125          Unit: '[x]'   mg/dL   '[]'  mmol/L           Apolipoprotein A1 (Apo A1)    '[]'   Yes  '[x]'   No   Result if yes:_____   Apolipoprotein B (ApoB)   '[]'   Yes  '[x]'   No    Result if yes:______   Lipoprotein(a) Available?   '[x]'   Yes  '[]'   No      Date:     20-Dec-2021      Result:  _151___      Unit:  nmol/L  Other Labs:   Creatinine Available?   '[x]'   Yes  '[]'   No      Date:   20-Dec-2021      Result:  _0.93__      Unit:  mg/dL   Estimated Glomerular Filtration Rate (eGFR) Available?   [  x]  Yes  '[]'   No      Date:     DD-MMM-YYYY      Result:  _60___      Unit:  mL/min/1.51m   Alanine Aminotransferase (ALT) / Serum Glutamic-Pyruvic Transaminase (SGPT)     Available?   '[]'   Yes  '[x]'   No       Date:     DD-MMM-YYYY      Result:  ____      Unit:  U/L      Normal Range: Lower Limit;    0      Normal Range: Upper Limit:   44   Aspartate Aminotransferase (AST) / Serum Glutamic-Oxaloacetic Transaminase (SGOT)     Available?   '[]'   Yes  '[x]'   No       Date:     DD-MMM-YYYY      Result:  ____      Unit:  U/L      Normal Range: Lower Limit;   15      Normal Range: Upper Limit:   41   Total Bilirubin   Available?   '[]'   Yes  '[]'   No       Date:     DD-MMM-YYYY      Result:  ____      Unit:  mg/dL       Normal Range: Lower Limit;   0.3      Normal Range: Upper Limit: 1.2      Glucose Available?   '[x]'   Yes  '[]'   No       Date: 18-Dec-2021      Result:   126____      Unit:  mg/dL     Normal Range: Lower Limit;  70     Normal Range: Upper Limit: 99  Hemoglobin A1c (HbA1c) Available?   '[x]'   Yes  '[]'   No       Date:     19-Dec-2021      Result:  _5.5___  (%)       Hemoglobin Available?   '[x]'   Yes  '[]'   No    Date:  19-Dec-2021       Hgb Level _14.7____      Unit:  g/dL  White Blood Cell Count Available?   '[x]'   Yes  '[]'   No   Date:  19-Dec-2021      WBC count:  7.4____      Unit:  1000/mmm3  Platelet Available?   '[x]'   Yes  '[]'   No       Date:     19-Dec-2021       Platelet count:  164____      Unit:  1000/mmm3      High-Sensitivity C-Reactive Protein (hsCRP).?'[]'   Yes  '[x]'   No      Result if Yes,  ____  Very Low Density Lipoprotein Cholesterol (VLD )? '[x]'  Yes  '[]'  No     Result if Yes,14 ____        Protocol # [id number]  Subject ID#   [id number]                           DAY 1 Date:        21-Dec-2021   Initial Study Treatment- Evolocumab Self-administration  Training for Evolocumab Self-Administration Completed?  '[x]'  YES  '[]'   NO  Training for Evolocumab Self-Administration Date:   _____  21-Dec-2021  Evolocumab Administered?  '[x]'  YES  '[]'   NO  Evolocumab Start Date: 21-Dec-2021  Lot Number of Initial Dose Administered: [id number]  Additional Lot Number Distributed to Subject: [id number]  Additional Lot Number Distributed to Subject: [id number]  Additional Lot Number Distributed to Subject: [id number]  Additional Lot Number Distributed to Subject: [id number]  Additional Lot Number Distributed to Subject: - [id number]  No current facility-administered medications for this visit. No current outpatient medications on file.  Facility-Administered Medications Ordered in Other Visits:    0.9 %  sodium chloride infusion, , Intravenous, Continuous, Belva Crome, MD, Stopped at 12/20/21 0700   0.9 %  sodium chloride infusion, 250 mL, Intravenous, PRN, Belva Crome, MD   acetaminophen (TYLENOL) tablet 650 mg, 650 mg, Oral, Q4H PRN,  Belva Crome, MD, 650 mg at 12/20/21 0242   ALPRAZolam Duanne Moron) tablet 0.5 mg, 0.5 mg, Oral, Daily PRN, Marykay Lex, MD, 0.5 mg at 12/20/21 2016   aspirin chewable tablet 81 mg, 81 mg, Oral, Daily, Belva Crome, MD, 81 mg at 12/21/21 0838   atorvastatin (LIPITOR) tablet 80 mg, 80 mg, Oral, Daily, Fay Records, MD, 80 mg at 12/21/21 8921   clopidogrel (PLAVIX) tablet 75 mg, 75 mg, Oral, Q breakfast, Belva Crome, MD, 75 mg at 12/21/21 0837   enoxaparin (LOVENOX) injection 40 mg, 40 mg, Subcutaneous, Q24H, O'Neal, Cassie Freer, MD, 40 mg at 12/20/21 1044   famotidine (PEPCID) tablet 20 mg, 20 mg, Oral, BID, Marykay Lex, MD, 20 mg at 12/21/21 1941   levalbuterol (XOPENEX) nebulizer solution 0.63 mg, 0.63 mg, Nebulization, Q8H PRN, Belva Crome, MD, 0.63 mg at 12/20/21 2202   loratadine (CLARITIN) tablet 10 mg, 10 mg, Oral, Daily, O'Neal, Cassie Freer, MD, 10 mg at 12/21/21 0837   losartan (COZAAR) tablet 50 mg, 50 mg, Oral, Daily, O'Neal, Cassie Freer, MD, 50 mg at 12/21/21 0837   metoprolol succinate (TOPROL-XL) 24 hr tablet 50 mg, 50 mg, Oral, Daily, Belva Crome, MD, 50 mg at 12/21/21 0933   morphine (PF) 4 MG/ML injection 4 mg, 4 mg, Intravenous, Q1H PRN, Belva Crome, MD   nitroGLYCERIN (NITROSTAT) SL tablet 0.4 mg, 0.4 mg, Sublingual, Q5 Min x 3 PRN, Belva Crome, MD   ondansetron [facility]) injection 4 mg, 4 mg, Intravenous, Q6H PRN, Belva Crome, MD   ondansetron (ZOFRAN-ODT) disintegrating tablet 4 mg, 4 mg, Oral, Q8H PRN, Belva Crome, MD, 4 mg at 12/19/21 0925   sodium chloride flush (NS) 0.9 % injection 3 mL, 3 mL, Intravenous, Q12H, Belva Crome, MD, 3 mL at 12/20/21 2231   sodium chloride flush (NS) 0.9 % injection 3 mL, 3 mL, Intravenous, PRN, Belva Crome, MD   spironolactone (ALDACTONE) tablet 12.5 mg, 12.5 mg, Oral, Daily, O'Neal, Cassie Freer, MD, 12.5 mg at 12/21/21 0837   Study - EVOLVE-MI - evolocumab (REPATHA) 140 mg/mL SQ injection (PI-Stuckey), 140  mg, Subcutaneous, Q14 Days, Hillary Bow, MD, 140 mg at 12/21/21 1005

## 2021-12-21 NOTE — Discharge Summary (Signed)
Discharge Summary    Patient ID: Mary Vance MRN: [id number]; DOB: Apr 13, 1960  Admit date: 12/18/2021 Discharge date: 12/21/2021  PCP:  Kathyrn Lass, MD   [place] Prov[facility] Cardiologist:  Evalina Field, MD     Discharge Diagnoses    Principal Problem:   NSTEMI (non-ST elevated myocardial infarction) [facility]) Active Problems:   NSTEMI, initial episode of care [facility])   HTN (hypertension)   Hyperlipidemia   Ischemic cardiomyopathy   Asthma    Diagnostic Studies/Procedures    Echocardiogram 12/19/21   1. Left ventricular ejection fraction, by estimation, is 40 to 45%. The  left ventricle has mildly decreased function. The left ventricle  demonstrates regional wall motion abnormalities (RCA and LCX territory).  Left ventricular diastolic parameters are  consistent with Grade II diastolic dysfunction (pseudonormalization).   2. Right ventricular systolic function is normal. The right ventricular  size is normal. Tricuspid regurgitation signal is inadequate for assessing  PA pressure.   3. Mild anterior mitral valve billowing without frank prolapse.. The  mitral valve is grossly normal. No evidence of mitral valve regurgitation.  No evidence of mitral stenosis.   4. The aortic valve is tricuspid. Aortic valve regurgitation is not  visualized. No aortic stenosis is present.   5. The inferior vena cava is normal in size with greater than 50%  respiratory variability, suggesting right atrial pressure of 3 mmHg.   Comparison(s): No prior Echocardiogram.   Left Heart Catheterization 12/19/21 CONCLUSIONS: 50 to 60% mid circumflex lesion suggesting that she could have closed and recanalized the circumflex to account for her presentation. Distal LAD after large diagonal contains 40% narrowing. Tandem 20 to 30% stenoses in the mid right coronary Ostial left main less than 25% Wall motion abnormality involving the inferobasal and posterior segment.  EF is in the  40 to 50% range.  EDP is 15.   RECOMMENDATIONS: High intensity statin therapy Dual antiplatelet with aspirin and Plavix therapy for at least 6 months then drop aspirin.  She was given a 300 mg load in the Cath Lab. IV nitroglycerin overnight then DC in a.m. DC IV heparin in a.m. If recurrent similar chest pain, would need relook and potentially PCI of the circumflex. There is an outside chance that the circumflex stenosis is an innocent bystander and that she has an atypical Takotsubo syndrome occurring under stressful circumstances (going to court for jury duty against her desire).  Diagnostic Dominance: Right  _____________   History of Present Illness     Mary Vance is a 61 y.o. female with with past medical history of left-sided breast cancer s/p lumpectomy 20 years ago, history of asthma who was seen on 10/10 for the evaluation of NSTEMI.  Per chart review, patient did have an oophorectomy for premenopausal breast cancer.  Patient does have a significant family history of heart issues.  Her mother had an MIat age 52.  She has 2 maternal uncles and 1 maternal aunt who both at age 40, 2 maternal uncles and 1 maternal aunt all underwent open heart surgery.  Personally, she has never been diagnosed with MI or stroke in the past.  Patient does report occasional marijuana use but no other illicit drug use.  She says her cholesterol is borderline high, however she has never been started on medication.  On presentation, patient reported that she was in her usual health until 12/18/2021.  She parked her car in the parking deck and was walking up the hill to  the court house when she started noticing exertional chest pain.  She stopped multiple times in route to the court house.  By the time she got to the court house, her chest pain had eased off.  She eventually sought medical attention at [facility].  Upon arrival, patient was hypertensive with blood pressure of 171/89.  Serial hsTn  elevated at 203>>[phone]>>866.  Renal function electrolytes normal.  Red blood cell count was also normal.  EKG showed normal sinus rhythm, no acute changes.  Patient was started on IV heparin, IV nitroglycerin.  Cardiology service was asked to admit for NSTEMI.  Hospital Course     Consultants: None  NSTEMI  - Patient presented complaining of exertional chest pressure, elevated hsTn [phone]. EKG with inferolateral T wave inversions  - She was taken to the Cath lab on 10/10--cath showed 50-60% mid circumflex lesion, 40% narrowing in distal LAD, tandem 20-30% stenosis in the mid right coronary, and ostial left main less than 25%. There were no targets for revascularization. Possible that the 50-60% mid circumflex lesion recannulized. There is also an outside chance that she had an atypical Takotsubo syndrome that occurred under stressful circumstances  - Patient was monitored in the hospital until 10/12-- she did not have recurrence of chest pain at that time.  - Continue lipitor 80 mg daily  - Continue DAPT with ASA, plavix for 1 year  - Continue metoprolol succinate 50 mg daily  - Patient has been referred to outpatient cardiac rehab. Has a follow up appointment with APP on 10/23. I have also messaged the office to arrange a follow up appointment with Dr. Audie Box in 4-6 weeks.   Ischemic Cardiomyopathy  Chronic Systolic Heart Failure  - Echo this admission showed EF 40-45% with regional wall motion abnormalities, grade II diastolic dysfunction, normal RV systolic function. Note that regional wall motion abnormalities/posterior wall hypokinesis does fit with a circumflex infarct, but no target for intervention on cath  - There was no clinical evidence of heart failure this admission  - Continue metoprolol succinate 50 mg daily  - Continue losartan 50 mg daily  - Continue aldactone 50 mg daily   HTN  - Continue metoprolol, losartan, aldactone as above   HLD  - Continue lipitor 80 mg  daily - Lipitor new this admission-- will need LFTs and lipid panel in 2-3 months   Asthma  - PRN inhaler   Did the patient have an acute coronary syndrome (MI, NSTEMI, STEMI, etc) this admission?:  Yes                               AHA/ACC Clinical Performance & Quality Measures: Aspirin prescribed? - Yes ADP Receptor Inhibitor (Plavix/Clopidogrel, Brilinta/Ticagrelor or Effient/Prasugrel) prescribed (includes medically managed patients)? - Yes Beta Blocker prescribed? - Yes High Intensity Statin (Lipitor 40-53m or Crestor 20-411m prescribed? - Yes EF assessed during THIS hospitalization? - Yes For EF <40%, was ACEI/ARB prescribed? - Yes For EF <40%, Aldosterone Antagonist (Spironolactone or Eplerenone) prescribed? - Yes Cardiac Rehab Phase II ordered (including medically managed patients)? - Yes   Patient seen and examined by Dr. O'Audie Boxnd was deemed stable for discharge.   Patient has a follow up appointment with HaAlmyra DeforestA-C on 10/23    _____________  Discharge Vitals Blood pressure (!) 147/85, pulse 71, temperature 98.4 F (36.9 C), temperature source Oral, resp. rate 18, height '5\' 6"'  (1.676 m), weight 56.7 kg, SpO2  100 %.  Filed Weights   12/18/21 1006 12/19/21 2000  Weight: 59 kg 56.7 kg    Labs & Radiologic Studies    CBC Recent Labs    12/20/21 0753 12/21/21 0144  WBC 6.4 6.7  HGB 14.4 14.8  HCT 43.3 42.6  MCV 95.0 93.0  PLT PLATELET CLUMPS NOTED ON SMEAR, COUNT APPEARS ADEQUATE 210   Basic Metabolic Panel Recent Labs    12/19/21 0618 12/20/21 0753 12/21/21 0144  NA 141 142 144  K 3.9 3.5 3.7  CL 106 106 105  CO2 '28 26 28  ' GLUCOSE 122* 92 101*  BUN '12 9 12  ' CREATININE 0.77 0.93 0.97  CALCIUM 9.3 9.0 9.6  MG 2.1  --   --    Liver Function Tests No results for input(s): "AST", "ALT", "ALKPHOS", "BILITOT", "PROT", "ALBUMIN" in the last 72 hours. No results for input(s): "LIPASE", "AMYLASE" in the last 72 hours. High Sensitivity Troponin:    Recent Labs  Lab 12/18/21 1035 12/18/21 1304 12/18/21 1903 12/20/21 0753  TROPONINIHS 203* 596* 866* 430*    BNP Invalid input(s): "POCBNP" D-Dimer No results for input(s): "DDIMER" in the last 72 hours. Hemoglobin A1C Recent Labs    12/19/21 0618  HGBA1C 5.5   Fasting Lipid Panel Recent Labs    12/19/21 0618  CHOL 191  HDL 52  LDLCALC 125*  TRIG 70  CHOLHDL 3.7   Thyroid Function Tests Recent Labs    12/19/21 0618  TSH 1.353   _____________  CARDIAC CATHETERIZATION  Result Date: 12/19/2021 CONCLUSIONS: 50 to 60% mid circumflex lesion suggesting that she could have closed and recanalized the circumflex to account for her presentation. Distal LAD after large diagonal contains 40% narrowing. Tandem 20 to 30% stenoses in the mid right coronary Ostial left main less than 25% Wall motion abnormality involving the inferobasal and posterior segment.  EF is in the 40 to 50% range.  EDP is 15. RECOMMENDATIONS: High intensity statin therapy Dual antiplatelet with aspirin and Plavix therapy for at least 6 months then drop aspirin.  She was given a 300 mg load in the Cath Lab. IV nitroglycerin overnight then DC in a.m. DC IV heparin in a.m. If recurrent similar chest pain, would need relook and potentially PCI of the circumflex. There is an outside chance that the circumflex stenosis is an innocent bystander and that she has an atypical Takotsubo syndrome occurring under stressful circumstances (going to court for jury duty against her desire).   ECHOCARDIOGRAM COMPLETE  Result Date: 12/19/2021    ECHOCARDIOGRAM REPORT   Patient Name:   Mary Vance Date of Exam: 12/19/2021 Medical Rec #:  [id number]      Height:       66.0 in Accession #:    [id number]     Weight:       130.0 lb Date of Birth:  [birth year]      BSA:          1.665 m Patient Age:    61 years       BP:           150/96 mmHg Patient Gender: F              HR:           66 bpm. Exam Location:  Inpatient Procedure: 2D  Echo and Intracardiac Opacification Agent Indications:    NSTEMI  History:        Patient has no prior history of Echocardiogram  examinations.  Sonographer:    Harvie Junior Referring Phys: [place] Comments: Technically difficult study due to poor echo windows. Image acquisition challenging due to respiratory motion. IMPRESSIONS  1. Left ventricular ejection fraction, by estimation, is 40 to 45%. The left ventricle has mildly decreased function. The left ventricle demonstrates regional wall motion abnormalities (RCA and LCX territory). Left ventricular diastolic parameters are consistent with Grade II diastolic dysfunction (pseudonormalization).  2. Right ventricular systolic function is normal. The right ventricular size is normal. Tricuspid regurgitation signal is inadequate for assessing PA pressure.  3. Mild anterior mitral valve billowing without frank prolapse.. The mitral valve is grossly normal. No evidence of mitral valve regurgitation. No evidence of mitral stenosis.  4. The aortic valve is tricuspid. Aortic valve regurgitation is not visualized. No aortic stenosis is present.  5. The inferior vena cava is normal in size with greater than 50% respiratory variability, suggesting right atrial pressure of 3 mmHg. Comparison(s): No prior Echocardiogram. FINDINGS  Left Ventricle: Left ventricular ejection fraction, by estimation, is 40 to 45%. The left ventricle has mildly decreased function. The left ventricle demonstrates regional wall motion abnormalities. Definity contrast agent was given IV to delineate the left ventricular endocardial borders. The left ventricular internal cavity size was normal in size. There is no left ventricular hypertrophy. Left ventricular diastolic parameters are consistent with Grade II diastolic dysfunction (pseudonormalization).  LV Wall Scoring: The antero-lateral wall, entire inferior wall, posterior wall, apical septal segment, and basal inferoseptal  segment are hypokinetic. Right Ventricle: The right ventricular size is normal. No increase in right ventricular wall thickness. Right ventricular systolic function is normal. Tricuspid regurgitation signal is inadequate for assessing PA pressure. Left Atrium: Left atrial size was normal in size. Right Atrium: Right atrial size was normal in size. Pericardium: There is no evidence of pericardial effusion. Presence of epicardial fat layer. Mitral Valve: Mild anterior mitral valve billowing without frank prolapse. The mitral valve is grossly normal. No evidence of mitral valve regurgitation. No evidence of mitral valve stenosis. Tricuspid Valve: The tricuspid valve is normal in structure. Tricuspid valve regurgitation is not demonstrated. No evidence of tricuspid stenosis. Aortic Valve: The aortic valve is tricuspid. Aortic valve regurgitation is not visualized. No aortic stenosis is present. Aortic valve mean gradient measures 2.0 mmHg. Aortic valve peak gradient measures 3.6 mmHg. Aortic valve area, by VTI measures 2.22 cm. Pulmonic Valve: The pulmonic valve was normal in structure. Pulmonic valve regurgitation is not visualized. No evidence of pulmonic stenosis. Aorta: The aortic root is normal in size and structure. Venous: The inferior vena cava is normal in size with greater than 50% respiratory variability, suggesting right atrial pressure of 3 mmHg. IAS/Shunts: No atrial level shunt detected by color flow Doppler.  LEFT VENTRICLE PLAX 2D LVIDd:         4.60 cm     Diastology LVIDs:         3.70 cm     LV e' medial:    6.74 cm/s LV PW:         0.90 cm     LV E/e' medial:  14.5 LV IVS:        0.90 cm     LV e' lateral:   6.42 cm/s LVOT diam:     2.00 cm     LV E/e' lateral: 15.2 LV SV:         44 LV SV Index:   26 LVOT Area:  3.14 cm  LV Volumes (MOD) LV vol d, MOD A2C: 73.6 ml LV vol d, MOD A4C: 96.6 ml LV vol s, MOD A2C: 40.5 ml LV vol s, MOD A4C: 54.9 ml LV SV MOD A2C:     33.1 ml LV SV MOD A4C:     96.6  ml LV SV MOD BP:      39.9 ml RIGHT VENTRICLE RV Basal diam:  3.00 cm RV Mid diam:    2.20 cm RV S prime:     9.79 cm/s TAPSE (M-mode): 1.3 cm LEFT ATRIUM             Index        RIGHT ATRIUM          Index LA diam:        2.60 cm 1.56 cm/m   RA Area:     8.05 cm LA Vol (A2C):   39.4 ml 23.66 ml/m  RA Volume:   17.60 ml 10.57 ml/m LA Vol (A4C):   30.0 ml 18.02 ml/m LA Biplane Vol: 34.2 ml 20.54 ml/m  AORTIC VALVE                    PULMONIC VALVE AV Area (Vmax):    2.26 cm     PV Vmax:       0.76 m/s AV Area (Vmean):   2.07 cm     PV Peak grad:  2.3 mmHg AV Area (VTI):     2.22 cm AV Vmax:           95.00 cm/s AV Vmean:          66.500 cm/s AV VTI:            0.198 m AV Peak Grad:      3.6 mmHg AV Mean Grad:      2.0 mmHg LVOT Vmax:         68.40 cm/s LVOT Vmean:        43.800 cm/s LVOT VTI:          0.140 m LVOT/AV VTI ratio: 0.71  AORTA Ao Root diam: 3.20 cm MITRAL VALVE               TRICUSPID VALVE MV Area (PHT): 5.13 cm    TR Peak grad:   21.7 mmHg MV Decel Time: 148 msec    TR Vmax:        233.00 cm/s MR Peak grad: 21.5 mmHg MR Vmax:      232.00 cm/s  SHUNTS MV E velocity: 97.90 cm/s  Systemic VTI:  0.14 m MV A velocity: 64.00 cm/s  Systemic Diam: 2.00 cm MV E/A ratio:  1.53 Rudean Haskell MD Electronically signed by Rudean Haskell MD Signature Date/Time: 12/19/2021/8:55:31 AM    Final    DG Chest 1 View  Result Date: 12/18/2021 CLINICAL DATA:  Chest pain. EXAM: CHEST  1 VIEW COMPARISON:  Chest x-ray May 10, 2007. FINDINGS: The heart size and mediastinal contours are within normal limits. Both lungs are clear. No visible pleural effusions or pneumothorax. No acute osseous abnormality. IMPRESSION: No active disease. Electronically Signed   By: Margaretha Sheffield M.D.   On: 12/18/2021 11:01   Disposition   Pt is being discharged home today in good condition.  Follow-up Plans & Appointments     Follow-up Information     [facility]. Go in 6 day(s).   Specialty: Cardiology Why: Hospital follow up PLEASE  bring a current medication list to appointment FREE valet parking, Entrance C, off [employer] information: [address] [id number] [place]        Mary Vance, [place] Follow up on 01/01/2022.   Specialties: Cardiology, Radiology Why: Appointment at 8:25 AM Contact information: [address] [place] [address] [phone]                Discharge Instructions     Amb Referral to Cardiac Rehabilitation   Complete by: As directed    Diagnosis: NSTEMI   After initial evaluation and assessments completed: Virtual Based Care may be provided alone or in conjunction with Phase 2 Cardiac Rehab based on patient barriers.: Yes   Intensive Cardiac Rehabilitation (ICR) [place] location only OR Traditional Cardiac Rehabilitation (TCR) *If criteria for ICR are not met will enroll in TCR [facility] only): Yes   Diet - low sodium heart healthy   Complete by: As directed    Discharge instructions   Complete by: As directed    PLEASE DO NOT MISS ANY DOSES OF YOUR PLAVIX!!!!! Also keep a log of you blood pressures and bring back to your follow up appt. Please call the office with any questions.   Patients taking blood thinners should generally stay away from medicines like ibuprofen, Advil, Motrin, naproxen, and Aleve due to risk of stomach bleeding. You may take Tylenol as directed or talk to your primary doctor about alternatives.  PLEASE ENSURE THAT YOU DO NOT RUN OUT OF YOUR PLAVIX. This medication is very important to remain on for at least one year. IF you have issues obtaining this medication due to cost please CALL the office 3-5 business days prior to running out in order to prevent missing doses of this medication.  Radial Site Care Refer to this sheet in the next few weeks. These instructions provide you with information on caring for  yourself after your procedure. Your caregiver may also give you more specific instructions. Your treatment has been planned according to current medical practices, but problems sometimes occur. Call your caregiver if you have any problems or questions after your procedure. HOME CARE INSTRUCTIONS You may shower the day after the procedure. Remove the bandage (dressing) and gently wash the site with plain soap and water. Gently pat the site dry.  Do not apply powder or lotion to the site.  Do not submerge the affected site in water for 3 to 5 days.  Inspect the site at least twice daily.  Do not flex or bend the affected arm for 24 hours.  No lifting over 5 pounds (2.3 kg) for 5 days after your procedure.  Do not drive home if you are discharged the same day of the procedure. Have someone else drive you.  You may drive 24 hours after the procedure unless otherwise instructed by your caregiver.  What to expect: Any bruising will usually fade within 1 to 2 weeks.  Blood that collects in the tissue (hematoma) may be painful to the touch. It should usually decrease in size and tenderness within 1 to 2 weeks.  SEEK IMMEDIATE MEDICAL CARE IF: You have unusual pain at the radial site.  You have redness, warmth, swelling, or pain at the radial site.  You have drainage (other than a small amount of blood on the dressing).  You have chills.  You have a fever or persistent symptoms for more than 72 hours.  You have a fever and your  symptoms suddenly get worse.  Your arm becomes pale, cool, tingly, or numb.  You have heavy bleeding from the site. Hold pressure on the site.   Increase activity slowly   Complete by: As directed         Discharge Medications   Allergies as of 12/21/2021       Reactions   Amoxicillin Swelling   Swelling of throat    Avelox [moxifloxacin] Other (See Comments)   "Deathly sick"    Celexa [citalopram Hydrobromide] Other (See Comments)   Nightmares    Codeine Other  (See Comments)   Gi upset    Doxycycline Other (See Comments)   Reflux   Keflex [cephalexin] Other (See Comments)   Unknown    Penicillins Other (See Comments)   Childhood reaction. "About died when I was a baby"         Medication List     TAKE these medications    acetaminophen 500 MG tablet Commonly known as: TYLENOL Take 1,000 mg by mouth 2 (two) times daily as needed (pain).   ALPRAZolam 1 MG tablet Commonly known as: XANAX Take 0.5 mg by mouth daily as needed for anxiety or sleep.   aspirin 81 MG chewable tablet Chew 1 tablet (81 mg total) by mouth daily. Start taking on: December 22, 2021   atorvastatin 80 MG tablet Commonly known as: LIPITOR Take 1 tablet (80 mg total) by mouth daily. Start taking on: December 22, 2021   cetirizine 10 MG tablet Commonly known as: ZYRTEC Take 10 mg by mouth daily.   clobetasol ointment 0.05 % Commonly known as: TEMOVATE Apply 1 Application topically 3 (three) times a week.   clopidogrel 75 MG tablet Commonly known as: PLAVIX Take 1 tablet (75 mg total) by mouth daily with breakfast. Start taking on: December 22, 2021   EVOLVE-MI evolocumab 140 mg/1 mL SQ injection Inject 1 mL (140 mg total) into the skin every 14 (fourteen) days. For Investigational Use Only. Inject subcutaneously into abdomen, thigh, or upper arm. Rotate injection sites and do not inject into areas where skin is tender, bruised, or red every 14 days. Please contact [place] for any questions or concerns regarding this medication.   famotidine 20 MG tablet Commonly known as: PEPCID Take 20 mg by mouth 2 (two) times daily.   levalbuterol 0.63 MG/3ML nebulizer solution Commonly known as: XOPENEX Use 1 vial (0.63 mg total) by nebulization every 8 (eight) hours as needed for wheezing or shortness of breath.   losartan 50 MG tablet Commonly known as: COZAAR Take 1 tablet (50 mg total) by mouth daily. Start taking on: December 22, 2021    metoprolol succinate 50 MG 24 hr tablet Commonly known as: TOPROL-XL Take 1 tablet (50 mg total) by mouth daily. Take with or immediately following a meal. Start taking on: December 22, 2021   Multi Adult Gummies Diona Fanti 2 tablets by mouth daily.   nitroGLYCERIN 0.4 MG SL tablet Commonly known as: NITROSTAT Place 1 tablet (0.4 mg total) under the tongue every 5 (five) minutes x 3 doses as needed for chest pain.   PROBIOTIC GUMMIES PO Take 2 tablets by mouth daily.   SALINE NASAL SPRAY NA Place 1 spray into the nose daily as needed (headache/dryness).   spironolactone 25 MG tablet Commonly known as: ALDACTONE Take 1/2 tablet (12.5 mg total) by mouth daily. Start taking on: December 22, 2021           Outstanding Labs/Studies  LFTs and lipid panel in 2-3 months   Duration of Discharge Encounter   Greater than 30 minutes including physician time.  Signed, Margie Billet, PA-C 12/21/2021, 10:46 AM

## 2021-12-27 ENCOUNTER — Ambulatory Visit
Admission: RE | Admit: 2021-12-27 | Discharge: 2021-12-27 | Disposition: A | Discharge status: Home / Self Care | Payer: BC Managed Care – PPO | Source: Ambulatory Visit | Attending: Family Medicine | Admitting: Family Medicine

## 2021-12-27 ENCOUNTER — Ambulatory Visit (HOSPITAL_COMMUNITY)
Admit: 2021-12-27 | Discharge: 2021-12-27 | Disposition: A | Discharge status: Home / Self Care | Payer: BC Managed Care – PPO | Attending: Adult Health | Admitting: Adult Health

## 2021-12-27 ENCOUNTER — Encounter (HOSPITAL_COMMUNITY): Payer: Self-pay

## 2021-12-27 ENCOUNTER — Other Ambulatory Visit (HOSPITAL_COMMUNITY): Payer: Self-pay

## 2021-12-27 ENCOUNTER — Telehealth (HOSPITAL_COMMUNITY): Payer: Self-pay | Admitting: *Deleted

## 2021-12-27 VITALS — BP 106/70 | HR 88 | Wt 131.2 lb

## 2021-12-27 DIAGNOSIS — I5022 Chronic systolic (congestive) heart failure: Secondary | ICD-10-CM

## 2021-12-27 DIAGNOSIS — J45909 Unspecified asthma, uncomplicated: Secondary | ICD-10-CM | POA: Diagnosis not present

## 2021-12-27 DIAGNOSIS — Z853 Personal history of malignant neoplasm of breast: Secondary | ICD-10-CM | POA: Insufficient documentation

## 2021-12-27 DIAGNOSIS — Z7984 Long term (current) use of oral hypoglycemic drugs: Secondary | ICD-10-CM | POA: Diagnosis not present

## 2021-12-27 DIAGNOSIS — I5020 Unspecified systolic (congestive) heart failure: Secondary | ICD-10-CM | POA: Diagnosis not present

## 2021-12-27 DIAGNOSIS — I110 Hypertensive heart disease with heart failure: Secondary | ICD-10-CM | POA: Diagnosis not present

## 2021-12-27 DIAGNOSIS — I255 Ischemic cardiomyopathy: Secondary | ICD-10-CM | POA: Diagnosis not present

## 2021-12-27 DIAGNOSIS — Z79899 Other long term (current) drug therapy: Secondary | ICD-10-CM | POA: Diagnosis not present

## 2021-12-27 DIAGNOSIS — Z7902 Long term (current) use of antithrombotics/antiplatelets: Secondary | ICD-10-CM | POA: Insufficient documentation

## 2021-12-27 DIAGNOSIS — I2510 Atherosclerotic heart disease of native coronary artery without angina pectoris: Secondary | ICD-10-CM | POA: Diagnosis not present

## 2021-12-27 DIAGNOSIS — Z1231 Encounter for screening mammogram for malignant neoplasm of breast: Secondary | ICD-10-CM | POA: Diagnosis not present

## 2021-12-27 LAB — BASIC METABOLIC PANEL
Anion gap: 6 (ref 5–15)
BUN: 9 mg/dL (ref 8–23)
CO2: 29 mmol/L (ref 22–32)
Calcium: 8.6 mg/dL — ABNORMAL LOW (ref 8.9–10.3)
Chloride: 102 mmol/L (ref 98–111)
Creatinine, Ser: 0.83 mg/dL (ref 0.44–1.00)
GFR, Estimated: >60 mL/min (ref >60)
Glucose, Bld: 109 mg/dL — ABNORMAL HIGH (ref 70–99)
Potassium: 3.5 mmol/L (ref 3.5–5.1)
Sodium: 137 mmol/L (ref 135–145)

## 2021-12-27 MED ORDER — EMPAGLIFLOZIN 10 MG PO TABS: 10.0000 mg | ORAL_TABLET | Freq: Every day | ORAL | 2 refills | Status: DC

## 2021-12-27 NOTE — Progress Notes (Signed)
HEART & VASCULAR TRANSITION OF CARE CONSULT NOTE     Referring Physician: Dr Marisue Ivan  Primary Care: Dr Sabra Heck  Primary Cardiologist: Dr Marisue Ivan   HPI: Referred to clinic by Dr Marisue Ivan for heart failure consultation.   [name] is a 61 year old with a history of breast cancer 2003 had radiation/lumpectomy, asthma, CAD, HTN, and HFmEF.   Admitted with chest pain in the setting of NSTEMI. Echo EF 40-45%. Had cath with 60% LCX lesion found with good flow. Echo EF 40-45%, WMA RCA/LCX.  Plan for 1 year of aspirin and plavix. Started on high intensity statin. aily and bb. Discharged 12/19/21. Weight 125 pounds.   Today she presents for post hospital HF TOC visit with her husband. Overall feeling fine. Denies SOB/PND/Orthopnea. No chest pain. Appetite ok. No fever or chills. Weight at home has been stable.  Taking all medications.    Cardiac Testing  Echo 12/19/21 LVEF 40-45% WMA RCA/LCX. Grade II DD.   Cath 12/19/21  50 to 60% mid circumflex lesion suggesting that she could have closed and recanalized the circumflex to account for her presentation. Distal LAD after large diagonal contains 40% narrowing. Tandem 20 to 30% stenoses in the mid right coronary Ostial left main less than 25% - Plan for dual antiplatelet with asa+ plavix. Drip aspirin after at least 6 months.   Review of Systems: [y] = yes, '[ ]'$  = no   General: Weight gain '[ ]'$ ; Weight loss '[ ]'$ ; Anorexia '[ ]'$ ; Fatigue [Y ]; Fever '[ ]'$ ; Chills '[ ]'$ ; Weakness '[ ]'$   Cardiac: Chest pain/pressure '[ ]'$ ; Resting SOB '[ ]'$ ; Exertional SOB '[ ]'$ ; Orthopnea '[ ]'$ ; Pedal Edema '[ ]'$ ; Palpitations '[ ]'$ ; Syncope '[ ]'$ ; Presyncope '[ ]'$ ; Paroxysmal nocturnal dyspnea'[ ]'$   Pulmonary: Cough '[ ]'$ ; Wheezing'[ ]'$ ; Hemoptysis'[ ]'$ ; Sputum '[ ]'$ ; Snoring '[ ]'$   GI: Vomiting'[ ]'$ ; Dysphagia'[ ]'$ ; Melena'[ ]'$ ; Hematochezia '[ ]'$ ; Heartburn'[ ]'$ ; Abdominal pain '[ ]'$ ; Constipation '[ ]'$ ; Diarrhea '[ ]'$ ; BRBPR '[ ]'$   GU: Hematuria'[ ]'$ ; Dysuria '[ ]'$ ; Nocturia'[ ]'$   Vascular: Pain in legs with walking '[ ]'$ ;  Pain in feet with lying flat '[ ]'$ ; Non-healing sores '[ ]'$ ; Stroke '[ ]'$ ; TIA '[ ]'$ ; Slurred speech '[ ]'$ ;  Neuro: Headaches'[ ]'$ ; Vertigo'[ ]'$ ; Seizures'[ ]'$ ; Paresthesias'[ ]'$ ;Blurred vision '[ ]'$ ; Diplopia '[ ]'$ ; Vision changes '[ ]'$   Ortho/Skin: Arthritis '[ ]'$ ; Joint pain '[ ]'$ ; Muscle pain '[ ]'$ ; Joint swelling '[ ]'$ ; Back Pain [ Y]; Rash '[ ]'$   Psych: Depression'[ ]'$ ; Anxiety'[ ]'$   Heme: Bleeding problems '[ ]'$ ; Clotting disorders '[ ]'$ ; Anemia '[ ]'$   Endocrine: Diabetes '[ ]'$ ; Thyroid dysfunction'[ ]'$    Past Medical History:  Diagnosis Date   Breast cancer ([place]) 2003   Lt breast   Cancer [facility])     Current Outpatient Medications  Medication Sig Dispense Refill   acetaminophen (TYLENOL) 500 MG tablet Take 1,000 mg by mouth 2 (two) times daily as needed (pain).     ALPRAZolam (XANAX) 1 MG tablet Take 0.5 mg by mouth daily as needed for anxiety or sleep.     aspirin 81 MG chewable tablet Chew 1 tablet (81 mg total) by mouth daily. 90 tablet 3   atorvastatin (LIPITOR) 80 MG tablet Take 1 tablet (80 mg total) by mouth daily. 90 tablet 3   cetirizine (ZYRTEC) 10 MG tablet Take 10 mg by mouth daily.     clobetasol ointment (TEMOVATE) 4.23 % Apply 1 Application topically 3 (three)  times a week.     clopidogrel (PLAVIX) 75 MG tablet Take 1 tablet (75 mg total) by mouth daily with breakfast. 90 tablet 3   famotidine (PEPCID) 20 MG tablet Take 20 mg by mouth 2 (two) times daily.     levalbuterol (XOPENEX) 0.63 MG/3ML nebulizer solution Use 1 vial (0.63 mg total) by nebulization every 8 (eight) hours as needed for wheezing or shortness of breath. 75 mL 12   losartan (COZAAR) 50 MG tablet Take 1 tablet (50 mg total) by mouth daily. 90 tablet 3   metoprolol succinate (TOPROL-XL) 50 MG 24 hr tablet Take 1 tablet (50 mg total) by mouth daily. Take with or immediately following a meal. 90 tablet 3   Multiple Vitamins-Minerals (MULTI ADULT GUMMIES) CHEW Chew 2 tablets by mouth daily.     nitroGLYCERIN (NITROSTAT) 0.4 MG SL tablet Place 1 tablet  (0.4 mg total) under the tongue every 5 (five) minutes x 3 doses as needed for chest pain. 25 tablet 12   Probiotic Product (PROBIOTIC GUMMIES PO) Take 2 tablets by mouth daily.     SALINE NASAL SPRAY NA Place 1 spray into the nose daily as needed (headache/dryness).     spironolactone (ALDACTONE) 25 MG tablet Take 1/2 tablet (12.5 mg total) by mouth daily. 45 tablet 3   Study - EVOLVE-MI - evolocumab (REPATHA) 140 mg/mL SQ injection (PI-Stuckey) Inject 1 mL (140 mg total) into the skin every 14 (fourteen) days. For Investigational Use Only. Inject subcutaneously into abdomen, thigh, or upper arm. Rotate injection sites and do not inject into areas where skin is tender, bruised, or red every 14 days. Please contact [place] for any questions or concerns regarding this medication. 12 mL 0   No current facility-administered medications for this encounter.    Allergies  Allergen Reactions   Amoxicillin Swelling    Swelling of throat    Avelox [Moxifloxacin] Other (See Comments)    "Deathly sick"    Celexa [Citalopram Hydrobromide] Other (See Comments)    Nightmares    Codeine Other (See Comments)    Gi upset    Doxycycline Other (See Comments)    Reflux    Keflex [Cephalexin] Other (See Comments)    Unknown    Penicillins Other (See Comments)    Childhood reaction. "About died when I was a baby"       Social History   Socioeconomic History   Marital status: Unknown    Spouse name: [name]   Number of children: 4   Years of education: Not on file   Highest education level: High school graduate  Occupational History    Comment: part time  Tobacco Use   Smoking status: Former    Types: Cigarettes    Quit date: 03/2001    Years since quitting: 20.8   Smokeless tobacco: Never   Tobacco comments:    Quit around 2000 after breast cancer diagnosis  Vaping Use   Vaping Use: Never used  Substance and Sexual Activity   Alcohol use: Not Currently    Comment:  socially   Drug use: Yes    Types: Marijuana    Comment: Sunday   Sexual activity: Not on file  Other Topics Concern   Not on file  Social History Narrative   Not on file   Social Determinants of Health   Financial Resource Strain: Low Risk  (12/20/2021)   Overall Financial Resource Strain (CARDIA)    Difficulty of Paying Living Expenses: Not hard  at all  Food Insecurity: No Food Insecurity (12/20/2021)   Hunger Vital Sign    Worried About Running Out of Food in the Last Year: Never true    Ran Out of Food in the Last Year: Never true  Transportation Needs: No Transportation Needs (12/20/2021)   PRAPARE - Hydrologist (Medical): No    Lack of Transportation (Non-Medical): No  Physical Activity: Not on file  Stress: Not on file  Social Connections: Not on file  Intimate Partner Violence: Not At Risk (12/20/2021)   Humiliation, Afraid, Rape, and Kick questionnaire    Fear of Current or Ex-Partner: No    Emotionally Abused: No    Physically Abused: No    Sexually Abused: No      Family History  Problem Relation Age of Onset   Heart attack Mother    Hyperlipidemia Mother    Lung cancer Mother    Alzheimer's disease Father    CAD Maternal Uncle    CAD Maternal Aunt     Vitals:   12/27/21 1013  BP: 106/70  Pulse: 88  SpO2: 96%  Weight: 59.5 kg (131 lb 3.2 oz)   Wt Readings from Last 3 Encounters:  12/27/21 59.5 kg (131 lb 3.2 oz)  12/19/21 56.7 kg (125 lb)     PHYSICAL EXAM: General:  Well appearing. No respiratory difficulty HEENT: normal Neck: supple. no JVD. Carotids 2+ bilat; no bruits. No lymphadenopathy or thryomegaly appreciated. Cor: PMI nondisplaced. Regular rate & rhythm. No rubs, gallops or murmurs. Lungs: clear Abdomen: soft, nontender, nondistended. No hepatosplenomegaly. No bruits or masses. Good bowel sounds. Extremities: no cyanosis, clubbing, rash, edema Neuro: alert & oriented x 3, cranial nerves grossly intact.  moves all 4 extremities w/o difficulty. Affect pleasant.  ASSESSMENT & PLAN: 1. HFmEF Echo EF 40-45%. ICM. Repeat ECHO in a few months.  NYHA II.  GDMT  Diuretic-does not need loop diuretics.  BB- Continue Toprol  XL 50 mg daily .  Ace/ARB/ARNI- Continue losartan 50 mg daily. Would hold off on switching to entresto with addition of jardiance.   MRA- Continue 12.5 mg spiro daily  SGLT2i- Add Jardiance 10 mg daily. Discussed purpose and start patient assistance. Given 30 day coupon.  - Check BMET   2. CAD LHC 12/19/21 - 60% LCX lesion, distal LAD 40% narrowing, Mid RCA 20-30% stenosis, Ostial L main 25% . - No chest pain.  -On asa+plavix+ statin + bb.  -Add jardiance 10 mg daily.   3. H/O Breast Cancer -2003 had lumpectomy and had radiation.    4. Asthma No wheeze on exam. Has inhaler .    Looks good today. Euvolemic. Optimize GDMT with addition of SGLT2i.   Referred to HFSW (PCP, Medications, Transportation, ETOH Abuse, Drug Abuse, Insurance, Financial ):  No Refer to Pharmacy: Yes  Refer to Home Health: No Refer to Advanced Heart Failure Clinic: No  Refer to General Cardiology: Existing [facility]   Follow up as needed.    Shamila Lerch NP-C  11:06 AM

## 2021-12-27 NOTE — Patient Instructions (Addendum)
Labs done today. We will contact you only if your labs are abnormal.  START Jardiance '10mg'$  (1 tablet) by mouth daily.  No other medication changes were made. Please continue all current medications as prescribed.  Your physician recommends that you keep your scheduled follow-up appointment with [facility]  If you have any questions or concerns before your next appointment please send [place] a message through [place] or call our office at [phone].    TO LEAVE A MESSAGE FOR THE NURSE SELECT OPTION 2, PLEASE LEAVE A MESSAGE INCLUDING: YOUR NAME DATE OF BIRTH CALL BACK NUMBER REASON FOR CALL**this is important as we prioritize the call backs  YOU WILL RECEIVE A CALL BACK THE SAME DAY AS LONG AS YOU CALL BEFORE 4:00 PM

## 2021-12-27 NOTE — Telephone Encounter (Signed)
Called to confirm Heart & Vascular Transitions of Care appointment at 10 am on 12/27/21. Patient reminded to bring all medications and pill box organizer with them. Confirmed patient has transportation. Gave directions, instructed to utilize [place] parking.  Confirmed appointment prior to ending call.    Earnestine Leys, BSN, Clinical cytogeneticist Only

## 2021-12-28 ENCOUNTER — Telehealth (HOSPITAL_COMMUNITY): Payer: Self-pay

## 2021-12-28 NOTE — Telephone Encounter (Signed)
Heart Failure Patient Advocate Encounter  Medication assistance forms for Jardiance [facility]) have been started; Patient has signed forms. Provider information will need to be completed before submitting.  Forms have been attached to patient chart under 'Media' tab.  Clista Bernhardt, CPhT Rx Patient Advocate Phone: [phone]

## 2022-01-01 ENCOUNTER — Ambulatory Visit: Payer: BC Managed Care – PPO | Attending: Physician Assistant | Admitting: Physician Assistant

## 2022-01-01 ENCOUNTER — Encounter: Payer: Self-pay | Admitting: Physician Assistant

## 2022-01-01 VITALS — BP 94/68 | HR 80 | Ht 66.0 in | Wt 129.0 lb

## 2022-01-01 DIAGNOSIS — I420 Dilated cardiomyopathy: Secondary | ICD-10-CM | POA: Diagnosis not present

## 2022-01-01 DIAGNOSIS — I2510 Atherosclerotic heart disease of native coronary artery without angina pectoris: Secondary | ICD-10-CM

## 2022-01-01 DIAGNOSIS — R531 Weakness: Secondary | ICD-10-CM

## 2022-01-01 DIAGNOSIS — E785 Hyperlipidemia, unspecified: Secondary | ICD-10-CM | POA: Diagnosis not present

## 2022-01-01 MED ORDER — LOSARTAN POTASSIUM 25 MG PO TABS: 25.0000 mg | ORAL_TABLET | Freq: Every day | ORAL | 3 refills | Status: DC

## 2022-01-01 NOTE — Patient Instructions (Addendum)
Medication Instructions:  DECREASE Losartan to 25 mg daily HOLD Jardiance until further notice  *If you need a refill on your cardiac medications before your next appointment, please call your pharmacy*  Lab Work: NONE ordered at this time of appointment   If you have labs (blood work) drawn today and your tests are completely normal, you will receive your results only by: [place] (if you have MyChart) OR A paper copy in the mail If you have any lab test that is abnormal or we need to change your treatment, we will call you to review the results.  Testing/Procedures: NONE ordered at this time of appointment   Follow-Up: At [facility], you and your health needs are our priority.  As part of our continuing mission to provide you with exceptional heart care, we have created designated Provider Care Teams.  These Care Teams include your primary Cardiologist (physician) and Advanced Practice Providers (APPs -  Physician Assistants and Nurse Practitioners) who all work together to provide you with the care you need, when you need it.  Your next appointment:   As previously scheduled   The format for your next appointment:   In Person  Provider:   Evalina Field, MD     Other Instructions Continue to monitor blood pressure at home. Please give our office a call if your top number of blood pressure is consistently less than 10.   Important Information About Sugar

## 2022-01-01 NOTE — Progress Notes (Unsigned)
Cardiology Office Note:    Date:  01/03/2022   ID:  Aftyn, Nott 1960-11-01, MRN [id number]  PCP:  Kathyrn Lass, MD   [facility] Cardiologist:  Evalina Field, MD     Referring MD: Kathyrn Lass, MD   Chief Complaint  Patient presents with   Follow-up    Post hospital.   Headache    History of Present Illness:    Mary Vance is a 61 y.o. female with a hx of  L breast cancer s/p lumpectomy 20 years ago, history of asthma, HLD and recently diagnosed CAD.  She has significant family history of heart issue with mother having MI at age 18.  She has 2 maternal uncles and 1 maternal aunt who underwent open heart surgery.  She recently presented to the [facility] after having chest discomfort walking up to a court house.  Troponin was elevated at 800.  Blood pressure was also elevated in the 170s.  Echocardiogram obtained on 12/19/2021 showed EF 40 to 45%, wall motion abnormality noted in the RCA and the left circumflex territory, grade 2 DD.  Subsequent cardiac catheterization performed on the same day showed 50 to 60% mid left circumflex lesion, 40% distal LAD lesion, 20 to 30% lesion in mid RCA, less than 25% left main lesion, EF 40 to 50%, LVEDP 15 mmHg.  There is a possibility that the 50 to 60% mid left circumflex lesion was previously occluded however the recannulized on IV heparin, however we could not rule out the possibility of atypical Takotsubo syndrome occurring under stressful circumstances either.  She was going to court for jury duty against her desire.  She was placed on aspirin and Plavix along with high-dose Lipitor.  She was also placed on goal-directed medical therapy for LV dysfunction including metoprolol succinate, losartan, and spironolactone.  The patient was seen by heart failure TOC clinic on 12/27/2021, Jardiance was added to her medical regimen.  Patient presents today for follow-up along with husband.  She denies any chest  pain.  She is trying to increase exercise level.  I think she can start on cardiac rehab.  She has no lower extremity edema, orthopnea or PND.  Since starting on the Jardiance last week, she has been having increased weakness and fogginess.  She denies any feeling of passing out or vision changes.  However she feels noticeably weaker since last week.  Blood pressure today is low.  I decided to hold off on Jardiance for now.  I will reduce losartan to 25 mg daily.  She will let us know if her systolic blood pressure is still persistently lower than 100.  If so, I likely will reduce metoprolol succinate down to 25 mg daily as well.  She is currently scheduled to see Dr. Audie Box in mid November.  I will defer to Dr. Audie Box to decide on when to repeat echocardiogram.  Since she is still fairly weak and trying to strengthen herself and the small possibility of Takotsubo cardiomyopathy, I recommend she hold off on jury duty which is currently scheduled for November 7.   Past Medical History:  Diagnosis Date   Breast cancer ([place]) 2003   Lt breast   Cancer [facility])     Past Surgical History:  Procedure Laterality Date   BACK SURGERY  1994   BREAST LUMPECTOMY Left 2003   LEFT HEART CATH AND CORONARY ANGIOGRAPHY N/A 12/19/2021   Procedure: LEFT HEART CATH AND CORONARY ANGIOGRAPHY;  Surgeon: Belva Crome, MD;  Location: [facility];  Service: Cardiovascular;  Laterality: N/A;   OOPHORECTOMY N/A     Current Medications: Current Meds  Medication Sig   acetaminophen (TYLENOL) 500 MG tablet Take 1,000 mg by mouth 2 (two) times daily as needed (pain).   ALPRAZolam (XANAX) 1 MG tablet Take 0.5 mg by mouth daily as needed for anxiety or sleep.   aspirin 81 MG chewable tablet Chew 1 tablet (81 mg total) by mouth daily.   atorvastatin (LIPITOR) 80 MG tablet Take 1 tablet (80 mg total) by mouth daily.   cetirizine (ZYRTEC) 10 MG tablet Take 10 mg by mouth daily.   clobetasol ointment (TEMOVATE) 9.67 %  Apply 1 Application topically 3 (three) times a week.   clopidogrel (PLAVIX) 75 MG tablet Take 1 tablet (75 mg total) by mouth daily with breakfast.   empagliflozin (JARDIANCE) 10 MG TABS tablet Take 1 tablet (10 mg total) by mouth daily before breakfast.   famotidine (PEPCID) 20 MG tablet Take 20 mg by mouth 2 (two) times daily.   levalbuterol (XOPENEX) 0.63 MG/3ML nebulizer solution Use 1 vial (0.63 mg total) by nebulization every 8 (eight) hours as needed for wheezing or shortness of breath.   metoprolol succinate (TOPROL-XL) 50 MG 24 hr tablet Take 1 tablet (50 mg total) by mouth daily. Take with or immediately following a meal.   Multiple Vitamins-Minerals (MULTI ADULT GUMMIES) CHEW Chew 2 tablets by mouth daily.   nitroGLYCERIN (NITROSTAT) 0.4 MG SL tablet Place 1 tablet (0.4 mg total) under the tongue every 5 (five) minutes x 3 doses as needed for chest pain.   Probiotic Product (PROBIOTIC GUMMIES PO) Take 2 tablets by mouth daily.   SALINE NASAL SPRAY NA Place 1 spray into the nose daily as needed (headache/dryness).   spironolactone (ALDACTONE) 25 MG tablet Take 1/2 tablet (12.5 mg total) by mouth daily.   Study - EVOLVE-MI - evolocumab (REPATHA) 140 mg/mL SQ injection (PI-Stuckey) Inject 1 mL (140 mg total) into the skin every 14 (fourteen) days. For Investigational Use Only. Inject subcutaneously into abdomen, thigh, or upper arm. Rotate injection sites and do not inject into areas where skin is tender, bruised, or red every 14 days. Please contact [place] for any questions or concerns regarding this medication.   [DISCONTINUED] losartan (COZAAR) 50 MG tablet Take 1 tablet (50 mg total) by mouth daily.     Allergies:   Amoxicillin, Avelox [moxifloxacin], Celexa [citalopram hydrobromide], Codeine, Doxycycline, Keflex [cephalexin], and Penicillins   Social History   Socioeconomic History   Marital status: Unknown    Spouse name: [name]   Number of children: 4    Years of education: Not on file   Highest education level: High school graduate  Occupational History    Comment: part time  Tobacco Use   Smoking status: Former    Types: Cigarettes    Quit date: 03/2001    Years since quitting: 20.8   Smokeless tobacco: Never   Tobacco comments:    Quit around 2000 after breast cancer diagnosis  Vaping Use   Vaping Use: Never used  Substance and Sexual Activity   Alcohol use: Not Currently    Comment: socially   Drug use: Yes    Types: Marijuana    Comment: Sunday   Sexual activity: Not on file  Other Topics Concern   Not on file  Social History Narrative   Not on file   Social Determinants of Health  Financial Resource Strain: Low Risk  (12/20/2021)   Overall Financial Resource Strain (CARDIA)    Difficulty of Paying Living Expenses: Not hard at all  Food Insecurity: No Food Insecurity (12/20/2021)   Hunger Vital Sign    Worried About Running Out of Food in the Last Year: Never true    Ran Out of Food in the Last Year: Never true  Transportation Needs: No Transportation Needs (12/20/2021)   PRAPARE - Hydrologist (Medical): No    Lack of Transportation (Non-Medical): No  Physical Activity: Not on file  Stress: Not on file  Social Connections: Not on file     Family History: The patient's family history includes Alzheimer's disease in her father; CAD in her maternal aunt and maternal uncle; Heart attack in her mother; Hyperlipidemia in her mother; Lung cancer in her mother.  ROS:   Please see the history of present illness.     All other systems reviewed and are negative.  EKGs/Labs/Other Studies Reviewed:    The following studies were reviewed today:  Echo 12/19/2021 1. Left ventricular ejection fraction, by estimation, is 40 to 45%. The  left ventricle has mildly decreased function. The left ventricle  demonstrates regional wall motion abnormalities (RCA and LCX territory).  Left ventricular  diastolic parameters are  consistent with Grade II diastolic dysfunction (pseudonormalization).   2. Right ventricular systolic function is normal. The right ventricular  size is normal. Tricuspid regurgitation signal is inadequate for assessing  PA pressure.   3. Mild anterior mitral valve billowing without frank prolapse.. The  mitral valve is grossly normal. No evidence of mitral valve regurgitation.  No evidence of mitral stenosis.   4. The aortic valve is tricuspid. Aortic valve regurgitation is not  visualized. No aortic stenosis is present.   5. The inferior vena cava is normal in size with greater than 50%  respiratory variability, suggesting right atrial pressure of 3 mmHg.   Comparison(s): No prior Echocardiogram.    Cath 12/19/2021 CONCLUSIONS: 50 to 60% mid circumflex lesion suggesting that she could have closed and recanalized the circumflex to account for her presentation. Distal LAD after large diagonal contains 40% narrowing. Tandem 20 to 30% stenoses in the mid right coronary Ostial left main less than 25% Wall motion abnormality involving the inferobasal and posterior segment.  EF is in the 40 to 50% range.  EDP is 15.   RECOMMENDATIONS: High intensity statin therapy Dual antiplatelet with aspirin and Plavix therapy for at least 6 months then drop aspirin.  She was given a 300 mg load in the Cath Lab. IV nitroglycerin overnight then DC in a.m. DC IV heparin in a.m. If recurrent similar chest pain, would need relook and potentially PCI of the circumflex. There is an outside chance that the circumflex stenosis is an innocent bystander and that she has an atypical Takotsubo syndrome occurring under stressful circumstances (going to court for jury duty against her desire).  EKG:  EKG is not ordered today.    Recent Labs: 12/19/2021: Magnesium 2.1; TSH 1.353 12/20/2021: B Natriuretic Peptide 450.9 12/21/2021: Hemoglobin 14.8; Platelets 154 12/27/2021: BUN 9;  Creatinine, Ser 0.83; Potassium 3.5; Sodium 137  Recent Lipid Panel    Component Value Date/Time   CHOL 191 12/19/2021 0618   TRIG 70 12/19/2021 0618   HDL 52 12/19/2021 0618   CHOLHDL 3.7 12/19/2021 0618   VLDL 14 12/19/2021 0618   LDLCALC 125 (H) 12/19/2021 0618     Risk Assessment/Calculations:  Physical Exam:    VS:  BP 94/68 (BP Location: Right Arm, Patient Position: Sitting, Cuff Size: Normal)   Pulse 80   Ht '5\' 6"'$  (1.676 m)   Wt 129 lb (58.5 kg)   BMI 20.82 kg/m       Wt Readings from Last 3 Encounters:  01/01/22 129 lb (58.5 kg)  12/27/21 131 lb 3.2 oz (59.5 kg)  12/19/21 125 lb (56.7 kg)     GEN:  Well nourished, well developed in no acute distress HEENT: Normal NECK: No JVD; No carotid bruits LYMPHATICS: No lymphadenopathy CARDIAC: RRR, no murmurs, rubs, gallops RESPIRATORY:  Clear to auscultation without rales, wheezing or rhonchi  ABDOMEN: Soft, non-tender, non-distended MUSCULOSKELETAL:  No edema; No deformity  SKIN: Warm and dry NEUROLOGIC:  Alert and oriented x 3 PSYCHIATRIC:  Normal affect   ASSESSMENT:    1. Coronary artery disease involving native coronary artery of native heart without angina pectoris   2. Weakness   3. Dilated cardiomyopathy ([place])   4. Hyperlipidemia LDL goal <70    PLAN:    In order of problems listed above:  CAD: Recently admitted to the hospital with NSTEMI and found to have low EF of 40 to 45%, cardiac catheterization revealed a 50 to 60% mid left circumflex lesion, 40% distal LAD lesion, 20 to 30% mid RCA lesion, less than 25% left main lesion.  It was felt she could potentially had a severe lesion in the 50 to 60% mid left circumflex territory that was reopened by the IV heparin prior to the cath.  No culprit lesion was identified.  She was placed on aspirin and the Plavix given elevated troponin  Weakness: She was initially feeling well after discharge, however has been feeling weak since last week after  Jardiance was added to her medical regimen.  We eventually decided to hold off on Jardiance at this time and reduce her losartan to 25 mg daily.  She is aware that we may rechallenge her with Jardiance if her weakness improve by the next visit  Dilated cardiomyopathy: EF 40 to 45% on recent echocardiogram.  It was unclear if the patient had either ischemic cardiomyopathy versus atypical Takotsubo cardiomyopathy.  Continue metoprolol succinate, reduce dose of losartan given weakness.  Weakness was more pronounced after starting on Jardiance last week, will hold Jardiance for now.   Hyperlipidemia: On Lipitor.  LDL goal less than 70    Cardiac Rehabilitation Eligibility Assessment  The patient is ready to start cardiac rehabilitation from a cardiac standpoint.          Medication Adjustments/Labs and Tests Ordered: Current medicines are reviewed at length with the patient today.  Concerns regarding medicines are outlined above.  No orders of the defined types were placed in this encounter.  Meds ordered this encounter  Medications   losartan (COZAAR) 25 MG tablet    Sig: Take 1 tablet (25 mg total) by mouth daily.    Dispense:  90 tablet    Refill:  3    Dose change new Rx    Patient Instructions  Medication Instructions:  DECREASE Losartan to 25 mg daily HOLD Jardiance until further notice  *If you need a refill on your cardiac medications before your next appointment, please call your pharmacy*  Lab Work: NONE ordered at this time of appointment   If you have labs (blood work) drawn today and your tests are completely normal, you will receive your results only by: [place] (if you have  MyChart) OR A paper copy in the mail If you have any lab test that is abnormal or we need to change your treatment, we will call you to review the results.  Testing/Procedures: NONE ordered at this time of appointment   Follow-Up: At [facility], you and your health needs  are our priority.  As part of our continuing mission to provide you with exceptional heart care, we have created designated Provider Care Teams.  These Care Teams include your primary Cardiologist (physician) and Advanced Practice Providers (APPs -  Physician Assistants and Nurse Practitioners) who all work together to provide you with the care you need, when you need it.  Your next appointment:   As previously scheduled   The format for your next appointment:   In Person  Provider:   Evalina Field, MD     Other Instructions Continue to monitor blood pressure at home. Please give our office a call if your top number of blood pressure is consistently less than 10.   Important Information About Sugar         Hilbert Corrigan, [place]  01/03/2022 4:18 PM    [facility]

## 2022-01-03 ENCOUNTER — Other Ambulatory Visit (HOSPITAL_COMMUNITY): Payer: Self-pay

## 2022-01-03 ENCOUNTER — Telehealth: Payer: Self-pay | Admitting: Physician Assistant

## 2022-01-03 ENCOUNTER — Other Ambulatory Visit: Payer: Self-pay | Admitting: Physician Assistant

## 2022-01-03 MED ORDER — METOPROLOL SUCCINATE ER 50 MG PO TB24: 25.0000 mg | ORAL_TABLET | Freq: Every day | ORAL | 3 refills | Status: DC

## 2022-01-03 NOTE — Telephone Encounter (Signed)
I spoke with Mary Vance, she remains very weak.  I decided to remove her spironolactone and losartan.  I will cut back her Toprol-XL down to 25 mg daily.  She has a follow-up in 3 weeks with Dr. Audie Box, during the meantime, I asked her to keep a blood pressure diary.

## 2022-01-03 NOTE — Telephone Encounter (Signed)
Pt called reporting she's been experiencing low BP since yesterday (reading listed below) and brain fog. Pt reported her losartan was recently just decreased to 25 mg daily.   122/68 before medication 1'06/78 87/62 98/52 '$ 88/58 112 (can't remember bottom number) later that night  BP reading from today: 106/70 before medication 83/58 after lunch '79/56 85/63 85/55 '$ 76/56 52 at 2pm  '85/63 85/55 92/61 '$ 57   Will forward to Pa for recommendations

## 2022-01-03 NOTE — Telephone Encounter (Signed)
Pt c/o BP issue: STAT if pt c/o blurred vision, one-sided weakness or slurred speech  1. What are your last 5 BP readings?  106/70 the morning before her medications 83/58 after lunch '79/56 85/63 85/55 '$ 76/56 52 at 2pm  '85/63 85/55 92/61 '$ 57 while on the phone with me  2. Are you having any other symptoms (ex. Dizziness, headache, blurred vision, passed out)? Patient states she feels foggy, and just tired.   3. What is your BP issue? Low BP, she states her medication was just changed on Monday. She was taken off of jardiance, and her losartan was lowered from '50mg'$  to '25mg'$  daily.

## 2022-01-19 ENCOUNTER — Telehealth: Payer: Self-pay | Admitting: Cardiovascular Disease

## 2022-01-19 NOTE — Telephone Encounter (Signed)
Patient trip going down the starts and her right ankle is really swollen. Wanted to make the dr aware of it and what she should do. Please advise

## 2022-01-19 NOTE — Telephone Encounter (Signed)
Spoke with pt, she reports that the right side of the right ankle is swollen. Her left foot is fine. She was coming down the stairs and stepped wrong, she heard a pop, and then she has noticed the swelling. She has been icing it off and on and she has been keeping it elevated and the swelling has gone down some. She did notice this morning when she put weight on that ankle she had a little pain. She is just concerned because of the plavix and she has developed some small bruising on her arms. Sounds like she has twisted her ankle, she will continue to ice and will get a wrap. She has a follow up appointment Tuesday next week.

## 2022-01-21 NOTE — Progress Notes (Unsigned)
Cardiology Office Note:   Date:  01/23/2022  NAME:  Mary Vance    MRN: [id number] DOB:  09/10/1960   PCP:  Kathyrn Lass, MD  Cardiologist:  Evalina Field, MD  Electrophysiologist:  None   Referring MD: Kathyrn Lass, MD   Chief Complaint  Patient presents with   Follow-up         History of Present Illness:   Mary Vance is a 61 y.o. female with a hx of NSTEMI, CHF, HTN who presents for follow-up.  Overall doing well.  Denies any chest pain or trouble breathing.  No signs of volume overload.  She reports she can exercise without limitations.  She stopped taking Jardiance due to low blood pressure.  She is on metoprolol.  I have instructed her to take this at night.  She will need her lipids rechecked.  She will also need her ejection fraction rechecked.  She denies any symptoms in office today.  She is doing well.  Problem List NSTEMI/CAD -12/2021 -60% LCX -> re-opened with medical therapy/med management  2. Systolic HF -EF 56-38% 3. HLD 4. HTN  Past Medical History: Past Medical History:  Diagnosis Date   Breast cancer ([place]) 2003   Lt breast   Cancer [facility])     Past Surgical History: Past Surgical History:  Procedure Laterality Date   BACK SURGERY  1994   BREAST LUMPECTOMY Left 2003   LEFT HEART CATH AND CORONARY ANGIOGRAPHY N/A 12/19/2021   Procedure: LEFT HEART CATH AND CORONARY ANGIOGRAPHY;  Surgeon: Belva Crome, MD;  Location: [facility];  Service: Cardiovascular;  Laterality: N/A;   OOPHORECTOMY N/A     Current Medications: Current Meds  Medication Sig   acetaminophen (TYLENOL) 500 MG tablet Take 1,000 mg by mouth 2 (two) times daily as needed (pain).   ALPRAZolam (XANAX) 1 MG tablet Take 0.5 mg by mouth daily as needed for anxiety or sleep.   aspirin 81 MG chewable tablet Chew 1 tablet (81 mg total) by mouth daily.   atorvastatin (LIPITOR) 80 MG tablet Take 1 tablet (80 mg total) by mouth daily.   CALCIUM PO Take by mouth.   cetirizine  (ZYRTEC) 10 MG tablet Take 10 mg by mouth daily.   clobetasol ointment (TEMOVATE) 7.56 % Apply 1 Application topically 3 (three) times a week.   clopidogrel (PLAVIX) 75 MG tablet Take 1 tablet (75 mg total) by mouth daily with breakfast.   famotidine (PEPCID) 20 MG tablet Take 20 mg by mouth 2 (two) times daily.   levalbuterol (XOPENEX) 0.63 MG/3ML nebulizer solution Use 1 vial (0.63 mg total) by nebulization every 8 (eight) hours as needed for wheezing or shortness of breath.   metoprolol succinate (TOPROL-XL) 50 MG 24 hr tablet Take 0.5 tablets (25 mg total) by mouth daily. Take with or immediately following a meal.   Multiple Vitamins-Minerals (MULTI ADULT GUMMIES) CHEW Chew 2 tablets by mouth daily.   nitroGLYCERIN (NITROSTAT) 0.4 MG SL tablet Place 1 tablet (0.4 mg total) under the tongue every 5 (five) minutes x 3 doses as needed for chest pain.   Probiotic Product (PROBIOTIC GUMMIES PO) Take 2 tablets by mouth daily.   SALINE NASAL SPRAY NA Place 1 spray into the nose daily as needed (headache/dryness).     Allergies:    Amoxicillin, Avelox [moxifloxacin], Celexa [citalopram hydrobromide], Codeine, Doxycycline, Keflex [cephalexin], and Penicillins   Social History: Social History   Socioeconomic History   Marital status: Married  Spouse name: [name]   Number of children: 4   Years of education: Not on file   Highest education level: High school graduate  Occupational History    Comment: part time  Tobacco Use   Smoking status: Former    Types: Cigarettes    Quit date: 03/2001    Years since quitting: 20.8   Smokeless tobacco: Never   Tobacco comments:    Quit around 2000 after breast cancer diagnosis  Vaping Use   Vaping Use: Never used  Substance and Sexual Activity   Alcohol use: Not Currently    Comment: socially   Drug use: Yes    Types: Marijuana    Comment: Sunday   Sexual activity: Not on file  Other Topics Concern   Not on file  Social History Narrative    Not on file   Social Determinants of Health   Financial Resource Strain: Low Risk  (12/20/2021)   Overall Financial Resource Strain (CARDIA)    Difficulty of Paying Living Expenses: Not hard at all  Food Insecurity: No Food Insecurity (12/20/2021)   Hunger Vital Sign    Worried About Running Out of Food in the Last Year: Never true    [place] in the Last Year: Never true  Transportation Needs: No Transportation Needs (12/20/2021)   PRAPARE - Hydrologist (Medical): No    Lack of Transportation (Non-Medical): No  Physical Activity: Not on file  Stress: Not on file  Social Connections: Not on file     Family History: The patient's family history includes Alzheimer's disease in her father; CAD in her maternal aunt and maternal uncle; Heart attack in her mother; Hyperlipidemia in her mother; Lung cancer in her mother.  ROS:   All other ROS reviewed and negative. Pertinent positives noted in the HPI.     EKGs/Labs/Other Studies Reviewed:   The following studies were personally reviewed by me today:  EKG:  EKG is ordered today.  The ekg ordered today demonstrates normal sinus rhythm heart 75, nonspecific ST-T changes, and was personally reviewed by me.   TTE 12/19/2021  1. Left ventricular ejection fraction, by estimation, is 40 to 45%. The  left ventricle has mildly decreased function. The left ventricle  demonstrates regional wall motion abnormalities (RCA and LCX territory).  Left ventricular diastolic parameters are  consistent with Grade II diastolic dysfunction (pseudonormalization).   2. Right ventricular systolic function is normal. The right ventricular  size is normal. Tricuspid regurgitation signal is inadequate for assessing  PA pressure.   3. Mild anterior mitral valve billowing without frank prolapse.. The  mitral valve is grossly normal. No evidence of mitral valve regurgitation.  No evidence of mitral stenosis.   4. The aortic  valve is tricuspid. Aortic valve regurgitation is not  visualized. No aortic stenosis is present.   5. The inferior vena cava is normal in size with greater than 50%  respiratory variability, suggesting right atrial pressure of 3 mmHg.   Recent Labs: 12/19/2021: Magnesium 2.1; TSH 1.353 12/20/2021: B Natriuretic Peptide 450.9 12/21/2021: Hemoglobin 14.8; Platelets 154 12/27/2021: BUN 9; Creatinine, Ser 0.83; Potassium 3.5; Sodium 137   Recent Lipid Panel    Component Value Date/Time   CHOL 191 12/19/2021 0618   TRIG 70 12/19/2021 0618   HDL 52 12/19/2021 0618   CHOLHDL 3.7 12/19/2021 0618   VLDL 14 12/19/2021 0618   LDLCALC 125 (H) 12/19/2021 0618    Physical Exam:  VS:  BP 116/80   Pulse 75   Ht '5\' 6"'$  (1.676 m)   Wt 130 lb 9.6 oz (59.2 kg)   SpO2 95%   BMI 21.08 kg/m    Wt Readings from Last 3 Encounters:  01/23/22 130 lb 9.6 oz (59.2 kg)  01/01/22 129 lb (58.5 kg)  12/27/21 131 lb 3.2 oz (59.5 kg)    General: Well nourished, well developed, in no acute distress Head: Atraumatic, normal size  Eyes: PEERLA, EOMI  Neck: Supple, no JVD Endocrine: No thryomegaly Cardiac: Normal S1, S2; RRR; no murmurs, rubs, or gallops Lungs: Clear to auscultation bilaterally, no wheezing, rhonchi or rales  Abd: Soft, nontender, no hepatomegaly  Ext: No edema, pulses 2+ Musculoskeletal: No deformities, BUE and BLE strength normal and equal Skin: Warm and dry, no rashes   Neuro: Alert and oriented to person, place, time, and situation, CNII-XII grossly intact, no focal deficits  Psych: Normal mood and affect   ASSESSMENT:   Mary Vance is a 61 y.o. female who presents for the following: 1. Coronary artery disease involving native coronary artery of native heart without angina pectoris   2. Hyperlipidemia LDL goal <70   3. Chronic systolic heart failure (HCC)     PLAN:   1. Coronary artery disease involving native coronary artery of native heart without angina pectoris 2.  Hyperlipidemia LDL goal <70 3. Chronic systolic heart failure (HCC) -Non-STEMI in October 2023.  She had a 60% left circumflex lesion that was likely recannulated with medical therapy.  PCI was deferred.  She denies symptoms of angina today.  Continue aspirin and Plavix.  She is on Lipitor 80 mg daily.  Recheck lipids and liver profile in 1 month.  No signs of heart failure.  Not tolerating medications due to low blood pressure.  She will take metoprolol succinate 25 mg at night.  No need for Lasix.  Recheck echocardiogram in 3 months.  She will see me back in 3 months at that point.  Disposition: Return in about 3 months (around 04/25/2022).  Medication Adjustments/Labs and Tests Ordered: Current medicines are reviewed at length with the patient today.  Concerns regarding medicines are outlined above.  Orders Placed This Encounter  Procedures   Lipid panel   Hepatic function panel   AMB referral to cardiac rehabilitation   EKG 12-Lead   ECHOCARDIOGRAM COMPLETE   No orders of the defined types were placed in this encounter.   Patient Instructions  Medication Instructions:  The current medical regimen is effective;  continue present plan and medications.  *If you need a refill on your cardiac medications before your next appointment, please call your pharmacy*   Lab Work: Come back on December 14th for LIPID, LIVER check.  If you have labs (blood work) drawn today and your tests are completely normal, you will receive your results only by: [place] (if you have MyChart) OR A paper copy in the mail If you have any lab test that is abnormal or we need to change your treatment, we will call you to review the results.   Testing/Procedures:  Echocardiogram (3 month) - Your physician has requested that you have an echocardiogram. Echocardiography is a painless test that uses sound waves to create images of your heart. It provides your doctor with information about the size and  shape of your heart and how well your heart's chambers and valves are working. This procedure takes approximately one hour. There are no restrictions for this procedure.  Follow-Up: At [facility], you and your health needs are our priority.  As part of our continuing mission to provide you with exceptional heart care, we have created designated Provider Care Teams.  These Care Teams include your primary Cardiologist (physician) and Advanced Practice Providers (APPs -  Physician Assistants and Nurse Practitioners) who all work together to provide you with the care you need, when you need it.  We recommend signing up for the patient portal called "MyChart".  Sign up information is provided on this After Visit Summary.  MyChart is used to connect with patients for Virtual Visits (Telemedicine).  Patients are able to view lab/test results, encounter notes, upcoming appointments, etc.  Non-urgent messages can be sent to your provider as well.   To learn more about what you can do with MyChart, go to [email or web address].    Your next appointment:   3 month(s)  The format for your next appointment:   In Person  Provider:   Evalina Field, MD           Time Spent with Patient: I have spent a total of 35 minutes with patient reviewing hospital notes, telemetry, EKGs, labs and examining the patient as well as establishing an assessment and plan that was discussed with the patient.  > 50% of time was spent in direct patient care.  Signed, Addison Naegeli. Audie Box, MD, [place]  [address] [place], [place] [address] [phone]  01/23/2022 10:17 AM

## 2022-01-23 ENCOUNTER — Encounter: Payer: Self-pay | Admitting: Cardiovascular Disease

## 2022-01-23 ENCOUNTER — Ambulatory Visit: Payer: BC Managed Care – PPO | Attending: Cardiovascular Disease | Admitting: Cardiovascular Disease

## 2022-01-23 VITALS — BP 116/80 | HR 75 | Ht 66.0 in | Wt 130.6 lb

## 2022-01-23 DIAGNOSIS — I5022 Chronic systolic (congestive) heart failure: Secondary | ICD-10-CM

## 2022-01-23 DIAGNOSIS — I2510 Atherosclerotic heart disease of native coronary artery without angina pectoris: Secondary | ICD-10-CM

## 2022-01-23 DIAGNOSIS — E785 Hyperlipidemia, unspecified: Secondary | ICD-10-CM

## 2022-01-23 NOTE — Patient Instructions (Signed)
Medication Instructions:  The current medical regimen is effective;  continue present plan and medications.  *If you need a refill on your cardiac medications before your next appointment, please call your pharmacy*   Lab Work: Come back on December 14th for LIPID, LIVER check.  If you have labs (blood work) drawn today and your tests are completely normal, you will receive your results only by: [place] (if you have MyChart) OR A paper copy in the mail If you have any lab test that is abnormal or we need to change your treatment, we will call you to review the results.   Testing/Procedures:  Echocardiogram (3 month) - Your physician has requested that you have an echocardiogram. Echocardiography is a painless test that uses sound waves to create images of your heart. It provides your doctor with information about the size and shape of your heart and how well your heart's chambers and valves are working. This procedure takes approximately one hour. There are no restrictions for this procedure.     Follow-Up: At [facility], you and your health needs are our priority.  As part of our continuing mission to provide you with exceptional heart care, we have created designated Provider Care Teams.  These Care Teams include your primary Cardiologist (physician) and Advanced Practice Providers (APPs -  Physician Assistants and Nurse Practitioners) who all work together to provide you with the care you need, when you need it.  We recommend signing up for the patient portal called "MyChart".  Sign up information is provided on this After Visit Summary.  MyChart is used to connect with patients for Virtual Visits (Telemedicine).  Patients are able to view lab/test results, encounter notes, upcoming appointments, etc.  Non-urgent messages can be sent to your provider as well.   To learn more about what you can do with MyChart, go to [email or web address].    Your next appointment:    3 month(s)  The format for your next appointment:   In Person  Provider:   Evalina Field, MD

## 2022-02-12 ENCOUNTER — Telehealth: Payer: Self-pay | Admitting: Cardiovascular Disease

## 2022-02-12 NOTE — Telephone Encounter (Signed)
   Pre-operative Risk Assessment    Patient Name: Mary Vance  DOB: May 13, 1960 MRN: [id number]     Request for Surgical Clearance    Procedure:   Dental fillings  Date of Surgery:  Clearance TBD                                 Surgeon:  Dr. Annitta [place] Surgeon's Group or Practice Name:  [facility]  Phone number:  [phone] Fax number:  [phone]   Type of Clearance Requested:   - Medical    Type of Anesthesia:  Local    Additional requests/questions:   They need to know if patient needs to be premedicated prior to dental work.  [name]   02/12/2022, 2:55 PM

## 2022-02-12 NOTE — Telephone Encounter (Addendum)
   Patient Name: Mary Vance  DOB: 31-Aug-1960 MRN: [id number]  Primary Cardiologist: Evalina Field, MD  Chart reviewed as part of pre-operative protocol coverage.   Simple dental extractions (i.e. 1-2 teeth), cleanings, and fillings are considered low risk procedures per guidelines and generally do not require any specific cardiac clearance. It is also generally accepted that for simple extractions and dental cleanings, there is no need to interrupt blood thinner therapy. SBE prophylaxis pre-medication is not required for the patient from a cardiac standpoint.  I will route this recommendation to the requesting party via Epic fax function and remove from pre-op pool.  Please call with questions.  Charlie Pitter, PA-C 02/12/2022, 4:11 PM

## 2022-02-23 ENCOUNTER — Other Ambulatory Visit (HOSPITAL_COMMUNITY): Payer: Self-pay

## 2022-03-09 ENCOUNTER — Other Ambulatory Visit (HOSPITAL_COMMUNITY): Payer: Self-pay

## 2022-03-15 ENCOUNTER — Encounter: Payer: Self-pay | Admitting: *Deleted

## 2022-03-15 DIAGNOSIS — Z006 Encounter for examination for normal comparison and control in clinical research program: Secondary | ICD-10-CM

## 2022-03-15 IMAGING — MG MM DIGITAL SCREENING BILAT W/ TOMO AND CAD
8 series · 9 of 24 positions shown · non-contrast
Comparison: Previous exam(s).

CLINICAL DATA: Screening.

EXAM:
DIGITAL SCREENING BILATERAL MAMMOGRAM WITH TOMOSYNTHESIS AND CAD
TECHNIQUE: Bilateral screening digital craniocaudal and mediolateral oblique
mammograms were obtained. Bilateral screening digital breast
tomosynthesis was performed. The images were evaluated with
computer-aided detection.

[R CC synth-2D]
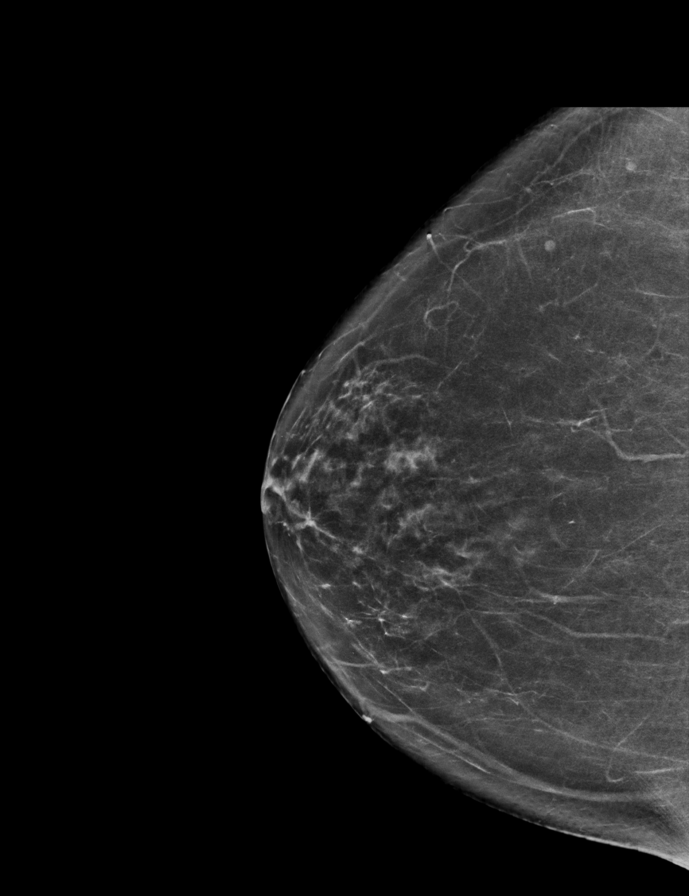

[L MLO synth-2D]
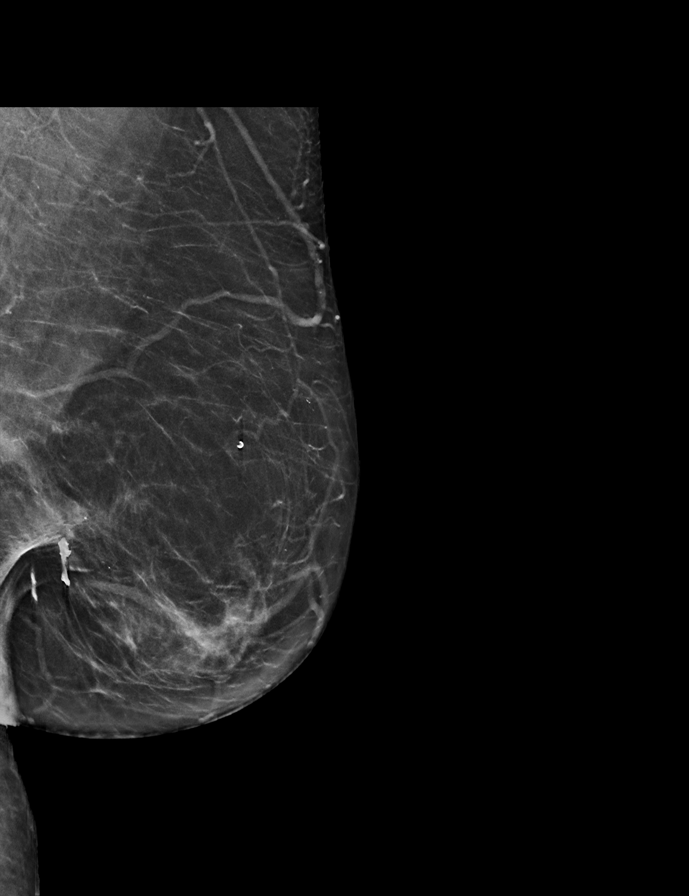

[L CC synth-2D]
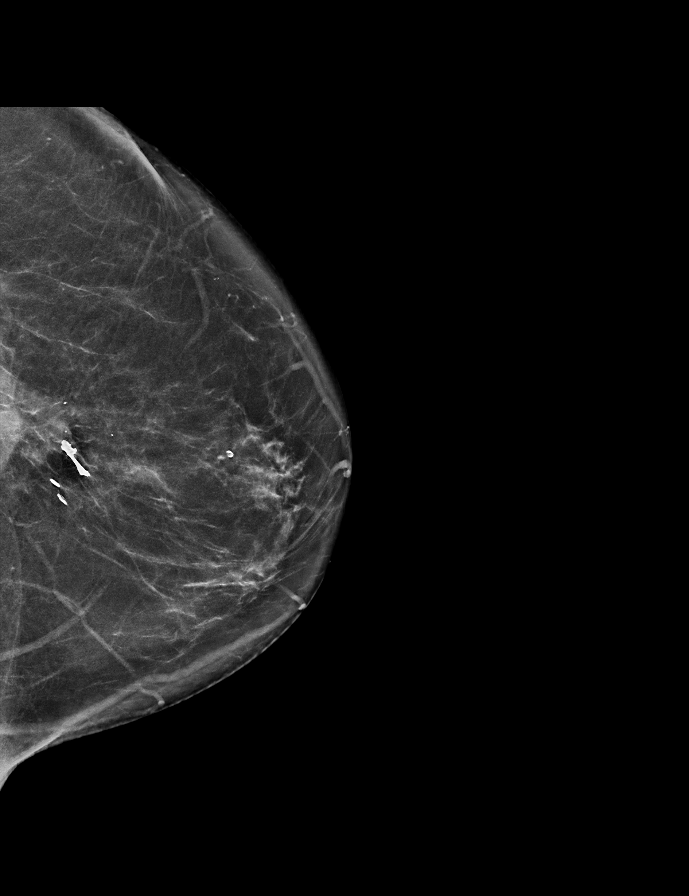

[R MLO synth-2D]
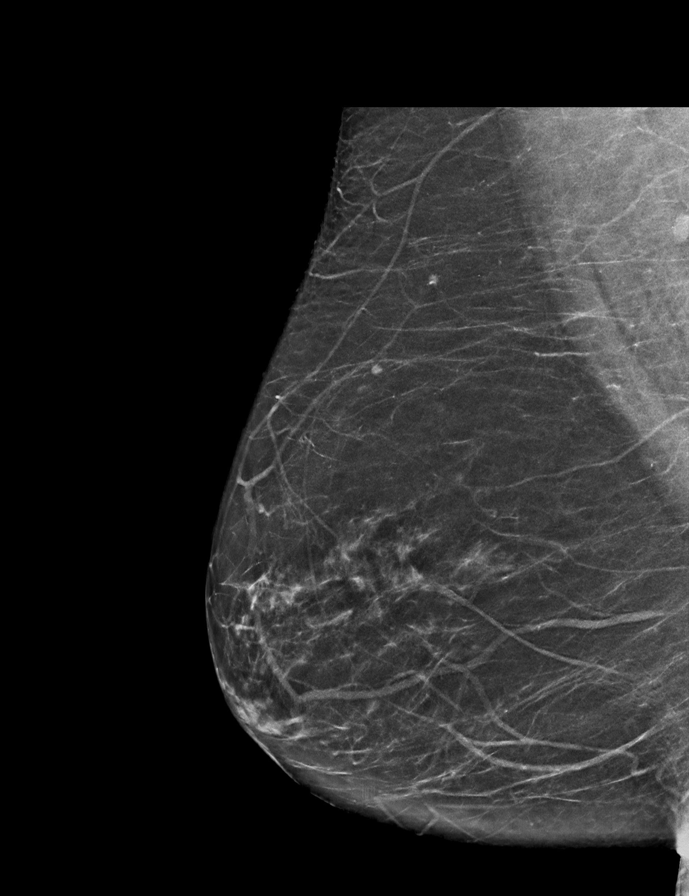

[L MLO tomo · 2 of 64 frames shown]
[frame 21/64]
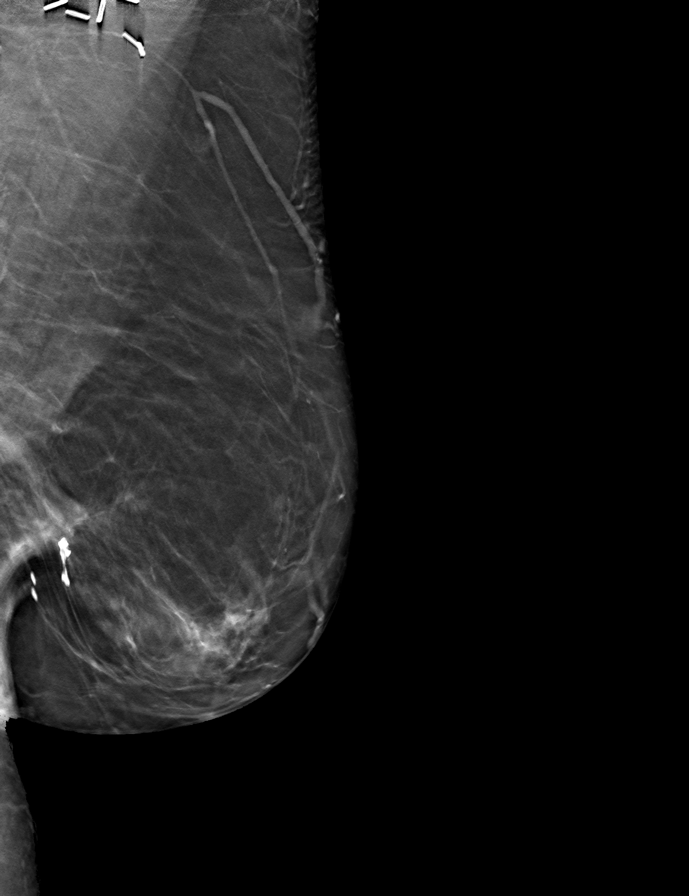
[frame 33/64]
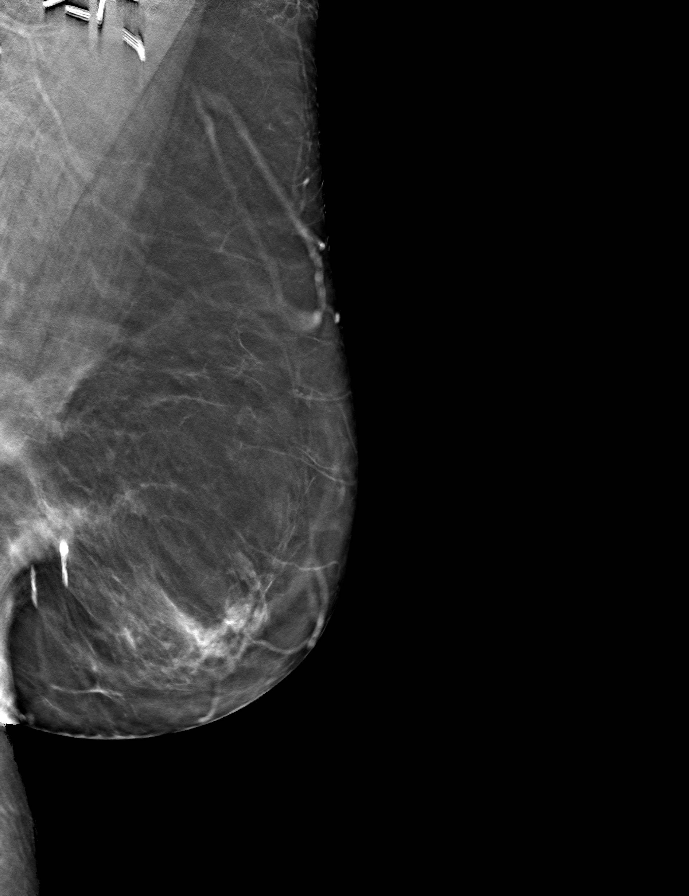

[R CC tomo · tomo slice 32/63.0]
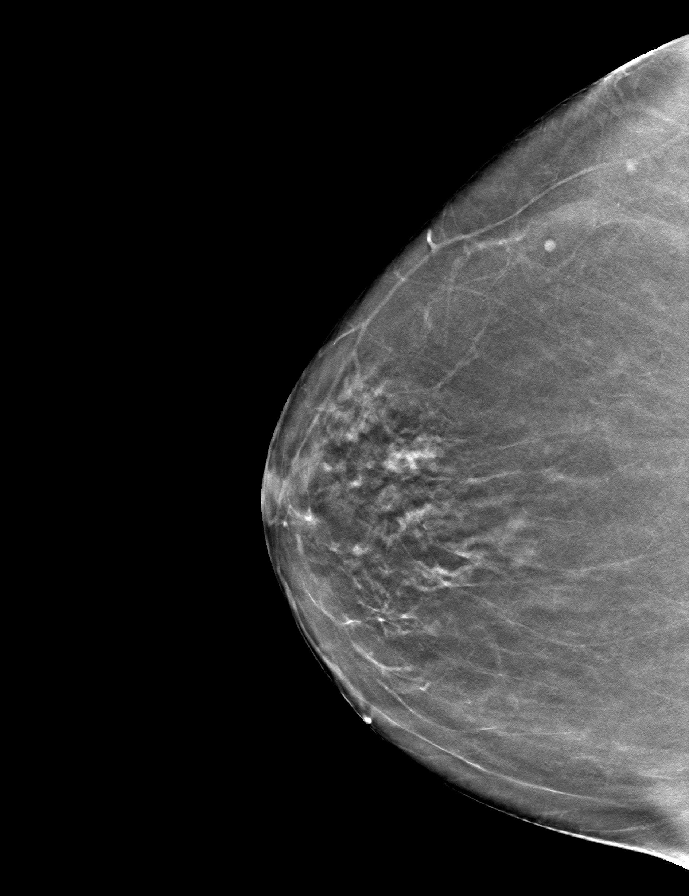

[R MLO tomo · tomo slice 35/68.0]
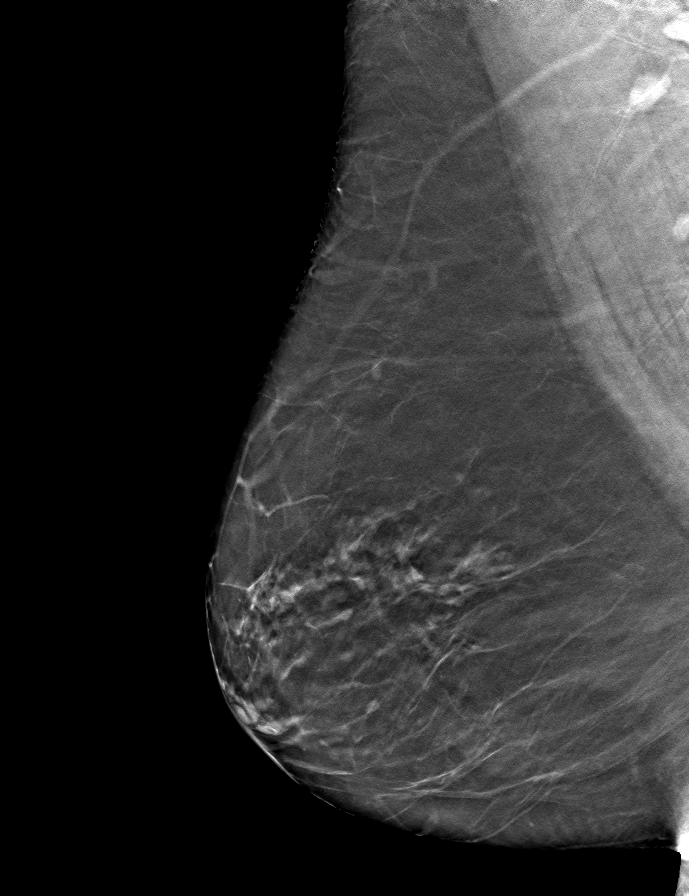

[L CC tomo · tomo slice 33/64.0]
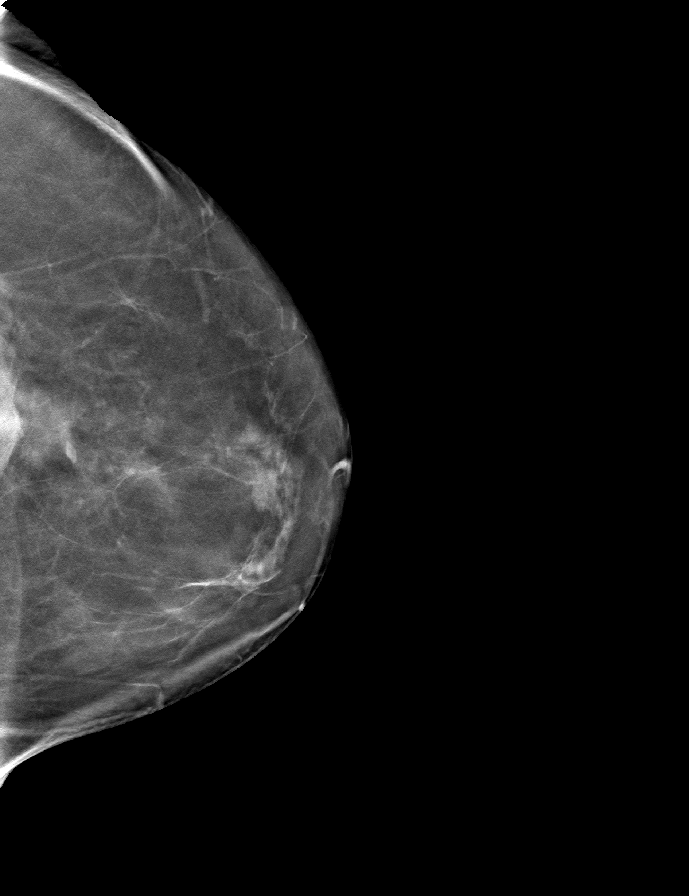

[9 of 24 positions shown; findings below may reference images not displayed]

ACR Breast Density Category b: There are scattered areas of
fibroglandular density.
FINDINGS: There are no findings suspicious for malignancy.
IMPRESSION: No mammographic evidence of malignancy. A result letter of this
screening mammogram will be mailed directly to the patient.

RECOMMENDATION:
Screening mammogram in one year. (Code:51-O-LD2)

BI-RADS CATEGORY  1: Negative.

## 2022-03-15 NOTE — Research (Signed)
Called patient for 12 week phone call states she has not taken medication since she left the hospital. I asked the patient did she want to come out of the study and she indicated that she wants to wait until she has her lab work done in February I told the patient I would call her back after she sees Dr. Audie Box on 04-30-2022   Follow-Up Visit Completed*   '[]'$  Not Necessary, No Potential Adverse Events Or Medication Issues Reported On Completed Subject Questionnaire   '[x]'$  Yes, Contact With Subject/Alternate Contact Completed   '[]'$  Yes, No Contact With Subject/Alternate Contact Completed, But Electronic Health Record Was Reviewed   '[]'$  No, Unable To Contact Subject/Alternate Contact   Have you reviewed Ongoing medications on the Targeted Concomitant Medication form and updated the form as needed?   '[x]'$  Yes   '[]'$  No   Subject Status*   '[x]'$  Continuing In Follow-up   '[]'$  At Risk For Lost To Follow-up   '[]'$  Withdrawal From All Future Study Activities Including Passive Follow-up By [place] Record Review Or Contact With Healthcare Provider Or Family Member/Friend   '[]'$  Death   Vital Status*   '[x]'$  Alive   '[]'$  Deceased   '[]'$  Unknown   Last Known To Be Alive Source*   '[x]'$  Subject Completed Follow-up Questionnaire/Seen In Person/Via Telephone Contact   '[]'$  Family Member or Caretaker   '[]'$  Primary Physician Or Medical Records   '[]'$  Publicly Available Source   '[]'$  Other  Date of last dose taken   21-Dec-2021  Over the last 12 weeks did the subject miss any doses? Yes-5  Over the last 12 weeks did the subject restart Evolocumab after an interruption? No

## 2022-03-23 ENCOUNTER — Encounter (HOSPITAL_COMMUNITY): Payer: Self-pay

## 2022-03-27 ENCOUNTER — Telehealth (HOSPITAL_COMMUNITY): Payer: Self-pay

## 2022-03-29 ENCOUNTER — Ambulatory Visit (HOSPITAL_COMMUNITY): Payer: BC Managed Care – PPO

## 2022-04-04 ENCOUNTER — Ambulatory Visit (HOSPITAL_COMMUNITY): Payer: BC Managed Care – PPO

## 2022-04-06 ENCOUNTER — Ambulatory Visit (HOSPITAL_COMMUNITY): Payer: BC Managed Care – PPO

## 2022-04-09 ENCOUNTER — Ambulatory Visit (HOSPITAL_COMMUNITY): Payer: BC Managed Care – PPO

## 2022-04-11 ENCOUNTER — Ambulatory Visit (HOSPITAL_COMMUNITY): Payer: BC Managed Care – PPO

## 2022-04-13 ENCOUNTER — Ambulatory Visit (HOSPITAL_COMMUNITY): Payer: BC Managed Care – PPO

## 2022-04-16 ENCOUNTER — Ambulatory Visit (HOSPITAL_COMMUNITY): Payer: BC Managed Care – PPO

## 2022-04-18 ENCOUNTER — Ambulatory Visit (HOSPITAL_COMMUNITY): Payer: BC Managed Care – PPO

## 2022-04-20 ENCOUNTER — Ambulatory Visit (HOSPITAL_COMMUNITY): Payer: BC Managed Care – PPO

## 2022-04-23 ENCOUNTER — Other Ambulatory Visit (HOSPITAL_COMMUNITY): Payer: BC Managed Care – PPO

## 2022-04-23 ENCOUNTER — Ambulatory Visit (HOSPITAL_COMMUNITY): Payer: BC Managed Care – PPO

## 2022-04-25 ENCOUNTER — Ambulatory Visit (HOSPITAL_COMMUNITY): Payer: BC Managed Care – PPO

## 2022-04-25 NOTE — Progress Notes (Signed)
Cardiology Office Note:   Date:  04/30/2022  NAME:  Mary Vance    MRN: [id number] DOB:  1960/04/24   PCP:  Kathyrn Lass, MD  Cardiologist:  Evalina Field, MD  Electrophysiologist:  None   Referring MD: Kathyrn Lass, MD   Chief Complaint  Patient presents with   Follow-up        History of Present Illness:   Mary Vance is a 62 y.o. female with a hx of CAD/NSTEMI, systolic hf who presents for follow-up.   She reports she is doing well.  Denies any chest pain or trouble breathing.  She is not exercising.  We discussed working on this.  Her blood pressure is elevated.  Apparently this fluctuates.  She has not had a repeat heart ultrasound.  She describes no symptoms of angina.  Her LDL cholesterol is 62.  This is close enough to goal.  She will participate in a trial for Repatha.  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  CV exam unchanged.  Denies any major symptoms in office.  Problem List NSTEMI/CAD -12/2021 -60% LCX -> re-opened with medical therapy/med management  2. Systolic HF -EF [id number] 3. HLD -T chol 136, HDL 61, LDL 61, triglycerides 68 4. HTN 5. Asthma   Past Medical History: Past Medical History:  Diagnosis Date   Breast cancer ([place]) 2003   Lt breast   Cancer [facility])     Past Surgical History: Past Surgical History:  Procedure Laterality Date   BACK SURGERY  1994   BREAST LUMPECTOMY Left 2003   LEFT HEART CATH AND CORONARY ANGIOGRAPHY N/A 12/19/2021   Procedure: LEFT HEART CATH AND CORONARY ANGIOGRAPHY;  Surgeon: Belva Crome, MD;  Location: [facility];  Service: Cardiovascular;  Laterality: N/A;   OOPHORECTOMY N/A     Current Medications: Current Meds  Medication Sig   acetaminophen (TYLENOL) 500 MG tablet Take 1,000 mg by mouth 2 (two) times daily as needed (pain).   ALPRAZolam (XANAX) 1 MG tablet Take 0.5 mg by mouth daily as needed for anxiety or sleep.   aspirin 81 MG chewable tablet Chew 1 tablet (81 mg total) by mouth daily.   atorvastatin  (LIPITOR) 80 MG tablet Take 1 tablet (80 mg total) by mouth daily.   CALCIUM PO Take by mouth.   cetirizine (ZYRTEC) 10 MG tablet Take 10 mg by mouth daily.   clobetasol ointment (TEMOVATE) [id number] % Apply 1 Application topically 3 (three) times a week.   clopidogrel (PLAVIX) 75 MG tablet Take 1 tablet (75 mg total) by mouth daily with breakfast.   famotidine (PEPCID) 20 MG tablet Take 20 mg by mouth 2 (two) times daily.   levalbuterol (XOPENEX) 0.63 MG/3ML nebulizer solution Use 1 vial (0.63 mg total) by nebulization every 8 (eight) hours as needed for wheezing or shortness of breath.   metoprolol succinate (TOPROL-XL) 50 MG 24 hr tablet Take 0.5 tablets (25 mg total) by mouth daily. Take with or immediately following a meal.   Multiple Vitamins-Minerals (MULTI ADULT GUMMIES) CHEW Chew 2 tablets by mouth daily.   nitroGLYCERIN (NITROSTAT) 0.4 MG SL tablet Place 1 tablet (0.4 mg total) under the tongue every 5 (five) minutes x 3 doses as needed for chest pain.   Probiotic Product (PROBIOTIC GUMMIES PO) Take 2 tablets by mouth daily.   SALINE NASAL SPRAY NA Place 1 spray into the nose daily as needed (headache/dryness).     Allergies:    Amoxicillin, Avelox [moxifloxacin], Celexa [citalopram hydrobromide], Codeine,  Doxycycline, Keflex [cephalexin], and Penicillins   Social History: Social History   Socioeconomic History   Marital status: Married    Spouse name: [name]   Number of children: 4   Years of education: Not on file   Highest education level: High school graduate  Occupational History    Comment: part time  Tobacco Use   Smoking status: Former    Types: Cigarettes    Quit date: 03/2001    Years since quitting: 21.1   Smokeless tobacco: Never   Tobacco comments:    Quit around 2000 after breast cancer diagnosis  Vaping Use   Vaping Use: Never used  Substance and Sexual Activity   Alcohol use: Not Currently    Comment: socially   Drug use: Yes    Types: Marijuana     Comment: Sunday   Sexual activity: Not on file  Other Topics Concern   Not on file  Social History Narrative   Not on file   Social Determinants of Health   Financial Resource Strain: Low Risk  (12/20/2021)   Overall Financial Resource Strain (CARDIA)    Difficulty of Paying Living Expenses: Not hard at all  Food Insecurity: No Food Insecurity (12/20/2021)   Hunger Vital Sign    Worried About Running Out of Food in the Last Year: Never true    [place] in the Last Year: Never true  Transportation Needs: No Transportation Needs (12/20/2021)   PRAPARE - Hydrologist (Medical): No    Lack of Transportation (Non-Medical): No  Physical Activity: Not on file  Stress: Not on file  Social Connections: Not on file     Family History: The patient's family history includes Alzheimer's disease in her father; CAD in her maternal aunt and maternal uncle; Heart attack in her mother; Hyperlipidemia in her mother; Lung cancer in her mother.  ROS:   All other ROS reviewed and negative. Pertinent positives noted in the HPI.     EKGs/Labs/Other Studies Reviewed:   The following studies were personally reviewed by me today:  Recent Labs: 12/19/2021: Magnesium 2.1; TSH 1.353 12/20/2021: B Natriuretic Peptide 450.9 12/21/2021: Hemoglobin 14.8; Platelets 154 12/27/2021: BUN 9; Creatinine, Ser 0.83; Potassium 3.5; Sodium 137 04/27/2022: ALT 30   Recent Lipid Panel    Component Value Date/Time   CHOL 136 04/27/2022 0936   TRIG 68 04/27/2022 0936   HDL 61 04/27/2022 0936   CHOLHDL 2.2 04/27/2022 0936   CHOLHDL 3.7 12/19/2021 0618   VLDL 14 12/19/2021 0618   LDLCALC 61 04/27/2022 0936    Physical Exam:   VS:  BP (!) 144/90 (BP Location: Left Arm, Patient Position: Sitting, Cuff Size: Normal)   Pulse 80   Ht 5' 6"$  (1.676 m)   Wt 137 lb 6.4 oz (62.3 kg)   SpO2 96%   BMI 22.18 kg/m    Wt Readings from Last 3 Encounters:  04/30/22 137 lb 6.4 oz (62.3  kg)  01/23/22 130 lb 9.6 oz (59.2 kg)  01/01/22 129 lb (58.5 kg)    General: Well nourished, well developed, in no acute distress Head: Atraumatic, normal size  Eyes: PEERLA, EOMI  Neck: Supple, no JVD Endocrine: No thryomegaly Cardiac: Normal S1, S2; RRR; no murmurs, rubs, or gallops Lungs: Scattered wheezing noted Abd: Soft, nontender, no hepatomegaly  Ext: No edema, pulses 2+ Musculoskeletal: No deformities, BUE and BLE strength normal and equal Skin: Warm and dry, no rashes   Neuro: Alert and  oriented to person, place, time, and situation, CNII-XII grossly intact, no focal deficits  Psych: Normal mood and affect   ASSESSMENT:   Mary Vance is a 62 y.o. female who presents for the following: 1. Coronary artery disease involving native coronary artery of native heart without angina pectoris   2. Hyperlipidemia LDL goal <70   3. Chronic systolic heart failure ([place])   4. Primary hypertension     PLAN:   1. Coronary artery disease involving native coronary artery of native heart without angina pectoris 2. Hyperlipidemia LDL goal <70 3. Chronic systolic heart failure ([place]) 4. Primary hypertension -Non-STEMI in October 2023.  Had a 60% left circumflex lesion that was managed medically.  This was thought to have recannulized.  Her EF was 40-45%.  No symptoms of heart failure.  She will have a repeat echocardiogram later this week.  LDL cholesterol 61.  On Lipitor 80.  No issues with this.  Liver enzymes are normal.  She will participate in a clinical trial for Repatha.  We discussed getting her LDL less than 55.  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  No symptoms of congestive heart failure.  Her blood pressure is elevated.  She will start checking a log twice daily.  She will work on salt reduction.  She needs to work on exercising as well.  She will see me back in 8 months for further evaluation.  Disposition: Return in about 8 months (around 12/29/2022).  Medication Adjustments/Labs and Tests  Ordered: Current medicines are reviewed at length with the patient today.  Concerns regarding medicines are outlined above.  No orders of the defined types were placed in this encounter.  No orders of the defined types were placed in this encounter.   Patient Instructions  Medication Instructions:  The current medical regimen is effective;  continue present plan and medications.  *If you need a refill on your cardiac medications before your next appointment, please call your pharmacy*   Follow-Up: At [facility], you and your health needs are our priority.  As part of our continuing mission to provide you with exceptional heart care, we have created designated Provider Care Teams.  These Care Teams include your primary Cardiologist (physician) and Advanced Practice Providers (APPs -  Physician Assistants and Nurse Practitioners) who all work together to provide you with the care you need, when you need it.  We recommend signing up for the patient portal called "MyChart".  Sign up information is provided on this After Visit Summary.  MyChart is used to connect with patients for Virtual Visits (Telemedicine).  Patients are able to view lab/test results, encounter notes, upcoming appointments, etc.  Non-urgent messages can be sent to your provider as well.   To learn more about what you can do with MyChart, go to [email or web address].    Your next appointment:   8 month(s)  Provider:   Evalina Field, MD     Take BP x2 daily- use log provided.     Time Spent with Patient: I have spent a total of 25 minutes with patient reviewing hospital notes, telemetry, EKGs, labs and examining the patient as well as establishing an assessment and plan that was discussed with the patient.  > 50% of time was spent in direct patient care.  Signed, Addison Naegeli. Audie Box, MD, [place]  [address] [place], [place] [address] [phone]  04/30/2022 11:21  AM

## 2022-04-27 ENCOUNTER — Ambulatory Visit (HOSPITAL_COMMUNITY): Payer: BC Managed Care – PPO

## 2022-04-27 DIAGNOSIS — E785 Hyperlipidemia, unspecified: Secondary | ICD-10-CM | POA: Diagnosis not present

## 2022-04-28 LAB — HEPATIC FUNCTION PANEL
ALT: 30 IU/L (ref 0–32)
AST: 24 IU/L (ref 0–40)
Albumin: 4.6 g/dL (ref 3.9–4.9)
Alkaline Phosphatase: 103 IU/L (ref 44–121)
Bilirubin Total: 0.5 mg/dL (ref 0.0–1.2)
Bilirubin, Direct: 0.15 mg/dL (ref 0.00–0.40)
Total Protein: 6.5 g/dL (ref 6.0–8.5)

## 2022-04-28 LAB — LIPID PANEL
Chol/HDL Ratio: 2.2 ratio (ref 0.0–4.4)
Cholesterol, Total: 136 mg/dL (ref 100–199)
HDL: 61 mg/dL (ref >39)
LDL Chol Calc (NIH): 61 mg/dL (ref 0–99)
Triglycerides: 68 mg/dL (ref 0–149)
VLDL Cholesterol Cal: 14 mg/dL (ref 5–40)

## 2022-04-30 ENCOUNTER — Ambulatory Visit (HOSPITAL_COMMUNITY): Payer: BC Managed Care – PPO

## 2022-04-30 ENCOUNTER — Encounter: Payer: Self-pay | Admitting: Cardiovascular Disease

## 2022-04-30 ENCOUNTER — Ambulatory Visit: Payer: BC Managed Care – PPO | Attending: Cardiovascular Disease | Admitting: Cardiovascular Disease

## 2022-04-30 VITALS — BP 144/90 | HR 80 | Ht 66.0 in | Wt 137.4 lb

## 2022-04-30 DIAGNOSIS — I2510 Atherosclerotic heart disease of native coronary artery without angina pectoris: Secondary | ICD-10-CM

## 2022-04-30 DIAGNOSIS — E785 Hyperlipidemia, unspecified: Secondary | ICD-10-CM

## 2022-04-30 DIAGNOSIS — I10 Essential (primary) hypertension: Secondary | ICD-10-CM | POA: Diagnosis not present

## 2022-04-30 DIAGNOSIS — I5022 Chronic systolic (congestive) heart failure: Secondary | ICD-10-CM | POA: Diagnosis not present

## 2022-04-30 NOTE — Patient Instructions (Signed)
Medication Instructions:  The current medical regimen is effective;  continue present plan and medications.  *If you need a refill on your cardiac medications before your next appointment, please call your pharmacy*   Follow-Up: At [facility], you and your health needs are our priority.  As part of our continuing mission to provide you with exceptional heart care, we have created designated Provider Care Teams.  These Care Teams include your primary Cardiologist (physician) and Advanced Practice Providers (APPs -  Physician Assistants and Nurse Practitioners) who all work together to provide you with the care you need, when you need it.  We recommend signing up for the patient portal called "MyChart".  Sign up information is provided on this After Visit Summary.  MyChart is used to connect with patients for Virtual Visits (Telemedicine).  Patients are able to view lab/test results, encounter notes, upcoming appointments, etc.  Non-urgent messages can be sent to your provider as well.   To learn more about what you can do with MyChart, go to [email or web address].    Your next appointment:   8 month(s)  Provider:   Evalina Field, MD     Take BP x2 daily- use log provided.

## 2022-05-02 ENCOUNTER — Ambulatory Visit (HOSPITAL_COMMUNITY): Payer: BC Managed Care – PPO

## 2022-05-02 ENCOUNTER — Ambulatory Visit (HOSPITAL_COMMUNITY): Payer: BC Managed Care – PPO | Attending: Cardiovascular Disease

## 2022-05-02 DIAGNOSIS — I5022 Chronic systolic (congestive) heart failure: Secondary | ICD-10-CM | POA: Insufficient documentation

## 2022-05-02 LAB — ECHOCARDIOGRAM COMPLETE
Area-P 1/2: 2.84 cm2
Calc EF: 53.3 %
S' Lateral: 3 cm
Single Plane A2C EF: 54.8 %
Single Plane A4C EF: 52.7 %

## 2022-05-04 ENCOUNTER — Ambulatory Visit (HOSPITAL_COMMUNITY): Payer: BC Managed Care – PPO

## 2022-05-07 ENCOUNTER — Ambulatory Visit (HOSPITAL_COMMUNITY): Payer: BC Managed Care – PPO

## 2022-05-09 ENCOUNTER — Ambulatory Visit (HOSPITAL_COMMUNITY): Payer: BC Managed Care – PPO

## 2022-05-11 ENCOUNTER — Ambulatory Visit (HOSPITAL_COMMUNITY): Payer: BC Managed Care – PPO

## 2022-05-14 ENCOUNTER — Ambulatory Visit (HOSPITAL_COMMUNITY): Payer: BC Managed Care – PPO

## 2022-05-16 ENCOUNTER — Ambulatory Visit (HOSPITAL_COMMUNITY): Payer: BC Managed Care – PPO

## 2022-05-18 ENCOUNTER — Ambulatory Visit (HOSPITAL_COMMUNITY): Payer: BC Managed Care – PPO

## 2022-05-21 ENCOUNTER — Ambulatory Visit (HOSPITAL_COMMUNITY): Payer: BC Managed Care – PPO

## 2022-05-22 ENCOUNTER — Telehealth (HOSPITAL_COMMUNITY): Payer: Self-pay

## 2022-05-22 NOTE — Telephone Encounter (Signed)
Called and spoke with pt in regards to CR, pt stated her doctor said she is doing fine and does not need CR at this time.   Closed referral

## 2022-05-23 ENCOUNTER — Ambulatory Visit (HOSPITAL_COMMUNITY): Payer: BC Managed Care – PPO

## 2022-05-25 ENCOUNTER — Ambulatory Visit (HOSPITAL_COMMUNITY): Payer: BC Managed Care – PPO

## 2022-05-28 DIAGNOSIS — I10 Essential (primary) hypertension: Secondary | ICD-10-CM | POA: Diagnosis not present

## 2022-05-28 DIAGNOSIS — I2510 Atherosclerotic heart disease of native coronary artery without angina pectoris: Secondary | ICD-10-CM | POA: Diagnosis not present

## 2022-05-28 DIAGNOSIS — I5022 Chronic systolic (congestive) heart failure: Secondary | ICD-10-CM | POA: Diagnosis not present

## 2022-05-28 DIAGNOSIS — J4541 Moderate persistent asthma with (acute) exacerbation: Secondary | ICD-10-CM | POA: Diagnosis not present

## 2022-05-28 DIAGNOSIS — Z0000 Encounter for general adult medical examination without abnormal findings: Secondary | ICD-10-CM | POA: Diagnosis not present

## 2022-05-30 ENCOUNTER — Telehealth: Payer: Self-pay | Admitting: Cardiovascular Disease

## 2022-05-30 ENCOUNTER — Telehealth: Payer: Self-pay | Admitting: *Deleted

## 2022-05-30 NOTE — Telephone Encounter (Signed)
Called patient to see if she was going to continue taking REPATHA since she is coming due on her 24 week visit patient states she has still not been taking the drug . I told her I would call her back next Thursday before we moved forward with the 24 week visit. If not there would be no need to have drug sent to her pharmacy if she is not going to use it.      [place] Timoth Schara, Research Coordinator 05-30-2022   12:25 pm

## 2022-05-30 NOTE — Telephone Encounter (Signed)
*  STAT* If patient is at the pharmacy, call can be transferred to refill team.   1. Which medications need to be refilled? (please list name of each medication and dose if known) Metoprolol  2. Which pharmacy/location (including street and city if local pharmacy) is medication to be sent to? [facility] [address], [place]  3. Do they need a 30 day or 90 day supply? 90 days and refills- please call today if possible- need her medicine please

## 2022-05-31 MED ORDER — METOPROLOL SUCCINATE ER 50 MG PO TB24: 25.0000 mg | ORAL_TABLET | Freq: Every day | ORAL | 3 refills | Status: DC

## 2022-05-31 NOTE — Telephone Encounter (Signed)
Spoke with patient letting her know her desired medication was sent to her desired pharmacy and I apologized for the wait. Patient verbalized understanding.

## 2022-06-01 MED ORDER — METOPROLOL SUCCINATE ER 50 MG PO TB24: 25.0000 mg | ORAL_TABLET | Freq: Every day | ORAL | 3 refills | Status: DC

## 2022-06-01 NOTE — Telephone Encounter (Signed)
Patient is following up due to the refill request not going through. Confirmation was not received. Please resend and contact patient to confirm when it goes through.

## 2022-06-01 NOTE — Telephone Encounter (Signed)
Call placed to pt, she has been made aware that [facility]

## 2022-06-01 NOTE — Addendum Note (Signed)
Addended by: Gaetano Net on: 06/01/2022 09:42 AM   Modules accepted: Orders

## 2022-06-28 ENCOUNTER — Encounter: Payer: Self-pay | Admitting: *Deleted

## 2022-06-28 DIAGNOSIS — Z006 Encounter for examination for normal comparison and control in clinical research program: Secondary | ICD-10-CM

## 2022-06-28 NOTE — Research (Signed)
24 week Follow-Up Visit Completed*    Not Necessary, No Potential Adverse Events Or Medication Issues Reported On Completed Subject Questionnaire    Yes, Contact With Subject/Alternate Contact Completed    Yes, No Contact With Subject/Alternate Contact Completed, But Electronic Health Record Was Reviewed    No, Unable To Contact Subject/Alternate Contact   Have you reviewed Ongoing medications on the Targeted Concomitant Medication form and updated the form as needed?    Yes    No   Subject Status*    Continuing In Follow-up    At Risk For Lost To Follow-up    Withdrawal From All Future Study Activities Including Passive Follow-up By Electronic Health Record Review Or Contact With Healthcare Provider Or Family Member/Friend    Death   Vital Status*    Alive    Deceased    Unknown   Last Known To Be Alive Source*    Subject Completed Follow-up Questionnaire/Seen In Person/Via Telephone Contact    Family Member or Caretaker    Primary Physician Or Medical Records    Publicly Available Source    Other  Date of last dose taken   12/Oct/2023  Over the last 12 weeks did the subject miss any doses? Yes   Over the last 12 weeks did the subject restart Evolocumab after an interruption? No   Subject does not want to continue with medication. She said her cardiologist would like her to take it she does not want to. Patient stated that she would still like to continue in follow up but will not take medication

## 2022-07-30 ENCOUNTER — Other Ambulatory Visit: Payer: Self-pay

## 2022-07-30 ENCOUNTER — Telehealth: Payer: Self-pay | Admitting: Cardiovascular Disease

## 2022-07-30 MED ORDER — NITROGLYCERIN 0.4 MG SL SUBL: 0.4000 mg | SUBLINGUAL_TABLET | SUBLINGUAL | 12 refills | Status: AC | PRN

## 2022-07-30 MED ORDER — METOPROLOL SUCCINATE ER 50 MG PO TB24: 25.0000 mg | ORAL_TABLET | Freq: Every day | ORAL | 3 refills | Status: DC

## 2022-07-30 NOTE — Telephone Encounter (Signed)
*  STAT* If patient is at the pharmacy, call can be transferred to refill team.   1. Which medications need to be refilled? (please list name of each medication and dose if known)   nitroGLYCERIN (NITROSTAT) 0.4 MG SL tablet  metoprolol succinate (TOPROL-XL) 50 MG 24 hr tablet   2. Which pharmacy/location (including street and city if local pharmacy) is medication to be sent to? [id number] [place], [place] - [address]   3. Do they need a 30 day or 90 day supply? 30 day

## 2022-08-20 DIAGNOSIS — J309 Allergic rhinitis, unspecified: Secondary | ICD-10-CM | POA: Diagnosis not present

## 2022-08-20 DIAGNOSIS — J4520 Mild intermittent asthma, uncomplicated: Secondary | ICD-10-CM | POA: Diagnosis not present

## 2022-08-20 DIAGNOSIS — U071 COVID-19: Secondary | ICD-10-CM | POA: Diagnosis not present

## 2022-09-04 ENCOUNTER — Encounter: Payer: Self-pay | Admitting: Cardiovascular Disease

## 2022-10-26 ENCOUNTER — Encounter: Payer: Self-pay | Admitting: *Deleted

## 2022-10-26 DIAGNOSIS — Z006 Encounter for examination for normal comparison and control in clinical research program: Secondary | ICD-10-CM

## 2022-10-26 NOTE — Research (Signed)
36- weekFollow-Up Visit Completed* off drug    []  Not Necessary, No Potential Adverse Events Or Medication Issues Reported On Completed Subject Questionnaire   []  Yes, Contact With Subject/Alternate Contact Completed   [x]  Yes, No Contact With Subject/Alternate Contact Completed, But Electronic Health Record Was Reviewed   []  No, Unable To Contact Subject/Alternate Contact   Have you reviewed Ongoing medications on the Targeted Concomitant Medication form and updated the form as needed?   [x]  Yes   []  No   Subject Status*   [x]  Continuing In Follow-up   []  At Risk For Lost To Follow-up   []  Withdrawal From All Future Study Activities Including Passive Follow-up By Electronic Health Record Review Or Contact With Healthcare Provider Or Family Member/Friend   []  Death   Vital Status*   [x]  Alive   []  Deceased   []  Unknown   Last Known To Be Alive Source*   []  Subject Completed Follow-up Questionnaire/Seen In Person/Via Telephone Contact   []  Family Member or Caretaker   [x]  Primary Physician Or Medical Records   []  Publicly Available Source   []  Other  Date of last dose taken   DAY/MONTH/YEAR n/a Insert SmartText    Over the last 12 weeks did the subject miss any doses? N/a  Over the last 12 weeks did the subject restart Evolocumab after an interruption? N/a

## 2022-10-31 NOTE — Progress Notes (Signed)
Telephone note   10/31/2022  I called and spoke with the patient in detail.  She was enrolled in the EVOLVE trial.  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  We spoke about the trial, implications of Lpa, and long term implications of her treatment.   She stopped treatment with Repatha because she has a fear of needles, and had a bad experience with the initial injection.  She stopped active treatment at that time.  I explained to her that I certainly understand, and we discussed the objectives of the study.   I did offer to talk with her more about Lp(a) in the clinic, and also offered to delay my own Repatha injection so she could watch me self inject.  Arturo Morton. Riley Kill, MD, [facility] Medical Director, [facility]

## 2022-11-24 ENCOUNTER — Other Ambulatory Visit: Payer: Self-pay | Admitting: Cardiology

## 2022-12-09 ENCOUNTER — Other Ambulatory Visit: Payer: Self-pay | Admitting: Cardiology

## 2022-12-12 DIAGNOSIS — Z23 Encounter for immunization: Secondary | ICD-10-CM | POA: Diagnosis not present

## 2022-12-12 DIAGNOSIS — F411 Generalized anxiety disorder: Secondary | ICD-10-CM | POA: Diagnosis not present

## 2022-12-12 DIAGNOSIS — F5101 Primary insomnia: Secondary | ICD-10-CM | POA: Diagnosis not present

## 2022-12-13 ENCOUNTER — Other Ambulatory Visit: Payer: Self-pay | Admitting: Family Medicine

## 2022-12-13 DIAGNOSIS — Z1231 Encounter for screening mammogram for malignant neoplasm of breast: Secondary | ICD-10-CM

## 2022-12-24 ENCOUNTER — Encounter: Payer: Self-pay | Admitting: *Deleted

## 2022-12-24 DIAGNOSIS — Z006 Encounter for examination for normal comparison and control in clinical research program: Secondary | ICD-10-CM

## 2022-12-24 NOTE — Research (Signed)
48 week Follow-Up Visit Completed* Patient is off drug but wants to restart states that Dr. Riley Kill said he would meet with her in person    []  Not Necessary, No Potential Adverse Events Or Medication Issues Reported On Completed Subject Questionnaire   [x]  Yes, Contact With Subject/Alternate Contact Completed   []  Yes, No Contact With Subject/Alternate Contact Completed, But Electronic Health Record Was Reviewed   []  No, Unable To Contact Subject/Alternate Contact   Have you reviewed Ongoing medications on the Targeted Concomitant Medication form and updated the form as needed?   [x]  Yes   []  No   Subject Status*   [x]  Continuing In Follow-up   []  At Risk For Lost To Follow-up   []  Withdrawal From All Future Study Activities Including Passive Follow-up By Electronic Health Record Review Or Contact With Healthcare Provider Or Family Member/Friend   []  Death   Vital Status*   [x]  Alive   []  Deceased   []  Unknown   Last Known To Be Alive Source*   [x]  Subject Completed Follow-up Questionnaire/Seen In Person/Via Telephone Contact   []  Family Member or Caretaker   []  Primary Physician Or Medical Records   []  Publicly Available Source   []  Other  Date of last dose taken   DAY/MONTH/YEAR n/a  Over the last 12 weeks did the subject miss any doses?n/a  Over the last 12 weeks did the subject restart Evolocumab after an interruption?n/a

## 2023-01-03 ENCOUNTER — Ambulatory Visit
Admission: RE | Admit: 2023-01-03 | Discharge: 2023-01-03 | Disposition: A | Discharge status: Home / Self Care | Payer: BC Managed Care – PPO | Source: Ambulatory Visit | Attending: Family Medicine | Admitting: Family Medicine

## 2023-01-03 DIAGNOSIS — Z1231 Encounter for screening mammogram for malignant neoplasm of breast: Secondary | ICD-10-CM | POA: Diagnosis not present

## 2023-01-03 HISTORY — DX: Personal history of irradiation: Z92.3

## 2023-02-13 NOTE — Progress Notes (Signed)
Cardiology Office Note:  .   Date:  02/14/2023  ID:  Mary, Vance Feb 15, 1961, MRN [id number] PCP: Sigmund Hazel, MD  [facility] Providers Cardiologist:  Reatha Harps, MD { History of Present Illness: .   Mary Vance is a 62 y.o. female with history of CAD, CHF, HLD who presents for follow-up.    History of Present Illness   Miss Mary Vance, a 62 year old female with a history of non-STEMI managed medically, systolic heart failure with recovered ejection fraction, hyperlipidemia, and hypertension, presents for follow-up. Her most recent echocardiogram shows her ejection fraction has improved to 55%, which is within normal limits. Her lipids are at goal with an LDL of 61, although her LPA is elevated at 151. She reports feeling significantly better compared to the previous year, particularly noting an improvement in her energy levels during a recent family gathering. She denies any chest pain or pressure but does report some difficulty breathing, which she attributes to her asthma. She has been managing her asthma symptoms with as-needed albuterol nebulizer treatments. She denies any new updates to her medical history.          Problem List NSTEMI/CAD -12/2021 -60% LCX -> re-opened with med tx 2. Systolic HF -EF 40-45% -EF 50-55% 04/2022 3. HLD -T chol 136, HDL 61, LDL 61, TG 68 -Lpa 151 4. HTN 5. Asthma     ROS: All other ROS reviewed and negative. Pertinent positives noted in the HPI.     Studies Reviewed: Marland Kitchen   EKG Interpretation Date/Time:  Thursday February 14 2023 13:05:18 EST Ventricular Rate:  74 PR Interval:  148 QRS Duration:  86 QT Interval:  370 QTC Calculation: 410 R Axis:   83  Text Interpretation: Normal sinus rhythm Normal ECG Confirmed by Lennie Odor [phone]) on 02/14/2023 1:11:06 PM   Physical Exam:   VS:  BP 122/80 (BP Location: Right Arm, Patient Position: Sitting, Cuff Size: Normal)   Pulse 74   Ht 5\' 6"  (1.676 m)   Wt 143 lb (64.9 kg)    SpO2 97%   BMI 23.08 kg/m    Wt Readings from Last 3 Encounters:  02/14/23 143 lb (64.9 kg)  04/30/22 137 lb 6.4 oz (62.3 kg)  01/23/22 130 lb 9.6 oz (59.2 kg)    GEN: Well nourished, well developed in no acute distress NECK: No JVD; No carotid bruits CARDIAC: RRR, no murmurs, rubs, gallops RESPIRATORY:  Clear to auscultation without rales, wheezing or rhonchi  ABDOMEN: Soft, non-tender, non-distended EXTREMITIES:  No edema; No deformity  ASSESSMENT AND PLAN: .   Assessment and Plan    Non-ST Elevation Myocardial Infarction (NSTEMI) Managed medically with no current symptoms of chest pain or pressure. EKG shows no acute ischemic changes or evidence of infarction. -Discontinue Clopidogrel. -Continue Aspirin indefinitely.  Hyperlipidemia LDL at goal (61). -Continue Lipitor 80mg .  Systolic Heart Failure Recovered with EF now at 55%. No symptoms of heart failure. -Continue Metoprolol Succinate 25mg  daily.  Asthma Noted wheezing on examination, patient reports needing to use Albuterol nebulizer as needed. -Refer to Pulmonology for further management.  Follow-up in 1 year unless changes occur.              Follow-up: Return in about 1 year (around 02/14/2024).  Time Spent with Patient: I have spent a total of 25 minutes caring for this patient today face to face, ordering and reviewing labs/tests, reviewing prior records/medical history, examining the patient, establishing an assessment and plan, communicating results/findings  to the patient/family, and documenting in the medical record.   Signed, Lenna Gilford. Flora Lipps, MD, [facility]  [facility]  [address] [place], [place] [address] [phone]  1:34 PM

## 2023-02-14 ENCOUNTER — Encounter: Payer: Self-pay | Admitting: Cardiovascular Disease

## 2023-02-14 ENCOUNTER — Ambulatory Visit: Payer: BC Managed Care – PPO | Attending: Cardiovascular Disease | Admitting: Cardiovascular Disease

## 2023-02-14 ENCOUNTER — Encounter: Payer: Self-pay | Admitting: *Deleted

## 2023-02-14 VITALS — BP 122/80 | HR 74 | Ht 66.0 in | Wt 143.0 lb

## 2023-02-14 DIAGNOSIS — E785 Hyperlipidemia, unspecified: Secondary | ICD-10-CM

## 2023-02-14 DIAGNOSIS — I2510 Atherosclerotic heart disease of native coronary artery without angina pectoris: Secondary | ICD-10-CM | POA: Diagnosis not present

## 2023-02-14 DIAGNOSIS — I5022 Chronic systolic (congestive) heart failure: Secondary | ICD-10-CM | POA: Diagnosis not present

## 2023-02-14 DIAGNOSIS — Z006 Encounter for examination for normal comparison and control in clinical research program: Secondary | ICD-10-CM

## 2023-02-14 DIAGNOSIS — R062 Wheezing: Secondary | ICD-10-CM

## 2023-02-14 DIAGNOSIS — J45909 Unspecified asthma, uncomplicated: Secondary | ICD-10-CM | POA: Diagnosis not present

## 2023-02-14 NOTE — Patient Instructions (Signed)
Medication Instructions:  - Stop Clopidogrel (PLAVIX)    *If you need a refill on your cardiac medications before your next appointment, please call your pharmacy*   Lab Work: None    If you have labs (blood work) drawn today and your tests are completely normal, you will receive your results only by: MyChart Message (if you have MyChart) OR A paper copy in the mail If you have any lab test that is abnormal or we need to change your treatment, we will call you to review the results.   Testing/Procedures: None    Follow-Up: At [facility], you and your health needs are our priority.  As part of our continuing mission to provide you with exceptional heart care, we have created designated Provider Care Teams.  These Care Teams include your primary Cardiologist (physician) and Advanced Practice Providers (APPs -  Physician Assistants and Nurse Practitioners) who all work together to provide you with the care you need, when you need it.  We recommend signing up for the patient portal called "MyChart".  Sign up information is provided on this After Visit Summary.  MyChart is used to connect with patients for Virtual Visits (Telemedicine).  Patients are able to view lab/test results, encounter notes, upcoming appointments, etc.  Non-urgent messages can be sent to your provider as well.   To learn more about what you can do with MyChart, go to [email or web address].    Your next appointment:   1 year(s)  The format for your next appointment:   In Person  Provider:   Reatha Harps, MD    Other Instructions Dr. Flora Lipps has referred you to [facility] for Asthma followup.

## 2023-02-28 DIAGNOSIS — J029 Acute pharyngitis, unspecified: Secondary | ICD-10-CM | POA: Diagnosis not present

## 2023-04-10 ENCOUNTER — Telehealth: Payer: Self-pay | Admitting: Cardiovascular Disease

## 2023-04-10 NOTE — Telephone Encounter (Signed)
Pt c/o medication issue:  1. Name of Medication:   metoprolol succinate (TOPROL-XL) 50 MG 24 hr tablet   2. How are you currently taking this medication (dosage and times per day)?   3. Are you having a reaction (difficulty breathing--STAT)?   4. What is your medication issue?   Patient stated her heart rate has been getting really high and she said she forgot to take her medication this morning.  Patient wants to know if she can take half of her medication now in addition to if she had already taken medication this morning.

## 2023-04-10 NOTE — Telephone Encounter (Signed)
Patient identification verified by 2 forms. Marilynn Rail, RN    Called and spoke to patient  Patient states:   -she is having elevated heart rate   -earlier she was having shortness of breath while cleaning   -she skipped a dose of Metoprolol this morning   -highest heart rate was 120BPM while cleaning   -resting heart rate decreased to ~90BPM   -blood pressure: 150/100 2:42pm, took Bp medication   -Blood pressure: 3:42pm 136/93 HR 83   -blood pressure: 3:55pm 140/93 Advised patient:   -continue to monitor BP/symptoms   -plan to resume normal schedule with medications tomorrow morning   -check BP 1-2 hours after medications  Reviewed ED warning signs/precautions  Patient verbalized understanding, no questions at this time

## 2023-04-23 ENCOUNTER — Encounter (HOSPITAL_BASED_OUTPATIENT_CLINIC_OR_DEPARTMENT_OTHER): Payer: Self-pay | Admitting: Pulmonary Disease

## 2023-04-23 ENCOUNTER — Ambulatory Visit (HOSPITAL_BASED_OUTPATIENT_CLINIC_OR_DEPARTMENT_OTHER): Payer: BC Managed Care – PPO | Admitting: Pulmonary Disease

## 2023-04-23 ENCOUNTER — Encounter: Payer: Self-pay | Admitting: *Deleted

## 2023-04-23 VITALS — BP 136/84 | HR 80 | Ht 66.0 in | Wt 142.1 lb

## 2023-04-23 DIAGNOSIS — R062 Wheezing: Secondary | ICD-10-CM | POA: Diagnosis not present

## 2023-04-23 DIAGNOSIS — R0600 Dyspnea, unspecified: Secondary | ICD-10-CM

## 2023-04-23 DIAGNOSIS — J4520 Mild intermittent asthma, uncomplicated: Secondary | ICD-10-CM | POA: Diagnosis not present

## 2023-04-23 DIAGNOSIS — R0982 Postnasal drip: Secondary | ICD-10-CM | POA: Diagnosis not present

## 2023-04-23 DIAGNOSIS — Z006 Encounter for examination for normal comparison and control in clinical research program: Secondary | ICD-10-CM

## 2023-04-23 DIAGNOSIS — K219 Gastro-esophageal reflux disease without esophagitis: Secondary | ICD-10-CM

## 2023-04-23 LAB — CBC WITH DIFFERENTIAL/PLATELET
Basophils Absolute: 0.1 10*3/uL (ref 0.0–0.2)
Basos: 1 %
EOS (ABSOLUTE): 0.3 10*3/uL (ref 0.0–0.4)
Eos: 5 %
Hematocrit: 44.7 % (ref 34.0–46.6)
Hemoglobin: 15.3 g/dL (ref 11.1–15.9)
Immature Grans (Abs): 0 10*3/uL (ref 0.0–0.1)
Immature Granulocytes: 0 %
Lymphocytes Absolute: 1.9 10*3/uL (ref 0.7–3.1)
Lymphs: 36 %
MCH: 32.2 pg (ref 26.6–33.0)
MCHC: 34.2 g/dL (ref 31.5–35.7)
MCV: 94 fL (ref 79–97)
Monocytes Absolute: 0.5 10*3/uL (ref 0.1–0.9)
Monocytes: 8 %
Neutrophils Absolute: 2.7 10*3/uL (ref 1.4–7.0)
Neutrophils: 50 %
Platelets: 159 10*3/uL (ref 150–450)
RBC: 4.75 x10E6/uL (ref 3.77–5.28)
RDW: 11.8 % (ref 11.7–15.4)
WBC: 5.5 10*3/uL (ref 3.4–10.8)

## 2023-04-23 NOTE — Progress Notes (Signed)
Subjective:    Patient ID: Mary Vance, female    DOB: 1960-09-20, 63 y.o.   MRN: [id number]  HPI 63 year old woman referred by cardiology for eval asthma Wheezing was noted on her last office visit in December   PMH : nSTEMI- 2V CAD on LHC 12/2021 - managed medically HFrEF - last EF recovered to 55% 04/2022 Breast CA s/p RT 2003   Discussed the use of AI scribe software for clinical note transcription with the patient, who gave verbal consent to proceed.  History of Present Illness   Mary Vance, a former smoker with a history of heart attack, asthma, breast cancer treated with radiation, and acid reflux, presents with intermittent shortness of breath. The symptom is more pronounced at night, during physical exertion, and in certain environmental conditions. She reports that cold weather, high humidity, dust, and exposure to cleaning products and certain fragrances exacerbate her breathing difficulties. She also notes that her breathing issues seem to worsen after exposure to jalapeno peppers. She has been using a nebulizer with levobuterol, which she finds helpful, but she reports a strong adverse reaction to the inhaler form of the same medication. She also mentions a history of postnasal drip and dry mouth. She has a cleaning service and makes homemade soap, both of which involve exposure to potential respiratory irritants. She has two dogs at home.     Triggers-dry weather, cleaning products, fragrant oils  Environment -2 dogs   Past Medical History:  Diagnosis Date   Breast cancer (HCC) 2003   Lt breast   Cancer (HCC)    Coronary artery disease    Hypertension    Personal history of radiation therapy    Past Surgical History:  Procedure Laterality Date   BACK SURGERY  1994   BREAST LUMPECTOMY Left 2003   LEFT HEART CATH AND CORONARY ANGIOGRAPHY N/A 12/19/2021   Procedure: LEFT HEART CATH AND CORONARY ANGIOGRAPHY;  Surgeon: Lyn Records, MD;  Location: [facility];   Service: Cardiovascular;  Laterality: N/A;   OOPHORECTOMY N/A     Allergies  Allergen Reactions   Amoxicillin Swelling    Swelling of throat    Avelox [Moxifloxacin] Other (See Comments)    "Deathly sick"    Celexa [Citalopram Hydrobromide] Other (See Comments)    Nightmares    Codeine Other (See Comments)    Gi upset    Doxycycline Other (See Comments)    Reflux    Keflex [Cephalexin] Other (See Comments)    Unknown    Penicillins Other (See Comments)    Childhood reaction. "About died when I was a baby"     Social History   Socioeconomic History   Marital status: Married    Spouse name: Arlys John   Number of children: 4   Years of education: Not on file   Highest education level: High school graduate  Occupational History    Comment: part time  Tobacco Use   Smoking status: Former    Current packs/day: 0.00    Types: Cigarettes    Quit date: 03/2001    Years since quitting: 22.1   Smokeless tobacco: Never   Tobacco comments:    Quit around 2000 after breast cancer diagnosis  Vaping Use   Vaping status: Never Used  Substance and Sexual Activity   Alcohol use: Not Currently    Comment: socially   Drug use: Yes    Types: Marijuana    Comment: Sunday   Sexual activity: Not on file  Other Topics Concern   Not on file  Social History Narrative   Not on file   Social Drivers of Health   Financial Resource Strain: Low Risk  (12/20/2021)   Overall Financial Resource Strain (CARDIA)    Difficulty of Paying Living Expenses: Not hard at all  Food Insecurity: No Food Insecurity (12/20/2021)   Hunger Vital Sign    Worried About Running Out of Food in the Last Year: Never true    Ran Out of Food in the Last Year: Never true  Transportation Needs: No Transportation Needs (12/20/2021)   PRAPARE - Administrator, Civil Service (Medical): No    Lack of Transportation (Non-Medical): No  Physical Activity: Not on file  Stress: Not on file  Social  Connections: Not on file  Intimate Partner Violence: Not At Risk (12/20/2021)   Humiliation, Afraid, Rape, and Kick questionnaire    Fear of Current or Ex-Partner: No    Emotionally Abused: No    Physically Abused: No    Sexually Abused: No    Family History  Problem Relation Age of Onset   Heart attack Mother    Hyperlipidemia Mother    Lung cancer Mother    Alzheimer's disease Father    CAD Maternal Uncle    CAD Maternal Aunt      Review of Systems Constitutional: negative for anorexia, fevers and sweats  Eyes: negative for irritation, redness and visual disturbance  Ears, nose, mouth, throat, and face: negative for earaches, epistaxis, nasal congestion and sore throat  Respiratory: negative for cough, dyspnea on exertion, sputum and wheezing  Cardiovascular: negative for chest pain, dyspnea, lower extremity edema, orthopnea, palpitations and syncope  Gastrointestinal: negative for abdominal pain, constipation, diarrhea, melena, nausea and vomiting  Genitourinary:negative for dysuria, frequency and hematuria  Hematologic/lymphatic: negative for bleeding, easy bruising and lymphadenopathy  Musculoskeletal:negative for arthralgias, muscle weakness and stiff joints  Neurological: negative for coordination problems, gait problems, headaches and weakness  Endocrine: negative for diabetic symptoms including polydipsia, polyuria and weight loss     Objective:   Physical Exam  Gen. Pleasant, well-nourished, in no distress, normal affect ENT - no pallor,icterus, no post nasal drip Neck: No JVD, no thyromegaly, no carotid bruits Lungs: no use of accessory muscles, no dullness to percussion, clear without rales or rhonchi  Cardiovascular: Rhythm regular, heart sounds  normal, no murmurs or gallops, no peripheral edema Abdomen: soft and non-tender, no hepatosplenomegaly, BS normal. Musculoskeletal: No deformities, no cyanosis or clubbing Neuro:  alert, non focal         Assessment & Plan:    Assessment and Plan    Asthma Intermittent asthma with triggers including dust, humidity, cold air, and certain chemicals. Symptoms include wheezing, shortness of breath, and nocturnal or exercise-induced exacerbations. Symptoms resurfaced post-radiation therapy for breast cancer in 2003. Managed with albuterol and levobuteral nebulizer treatments. Discussed risks of frequent albuterol and levobuteral use, including tachycardia and tremors. Symptoms are intermittent and managed with as-needed levobuteral nebulizer and emergency inhaler. Breathing test needed to confirm diagnosis and assess lung function. Allergy panel recommended to identify triggers. - Continue using levobuteral nebulizer as needed. -Use xopenex MDI as emergency inhaler. - Order a breathing test to assess lung function and confirm asthma diagnosis. - Run an allergy panel to identify potential triggers.  Postnasal Drip Intermittent postnasal drip, exacerbated by dry conditions and possibly related to allergies. Symptoms include throat clearing and dry mouth. Discussed use of Zyrtec to manage symptoms. - Continue  using Zyrtec as needed.  Gastroesophageal Reflux Disease (GERD) Daily heartburn managed with medication. Symptoms worsened post-radiation therapy for breast cancer. Importance of continuing current medication regimen discussed. - Continue current GERD medication regimen.  Follow-up - Schedule a follow-up appointment in 3-4 months. - Set up a breathing test within the next month.

## 2023-04-23 NOTE — Patient Instructions (Signed)
VISIT SUMMARY:  During today's visit, we discussed your intermittent shortness of breath, postnasal drip, and acid reflux. We reviewed your current treatments and made some adjustments to better manage your symptoms.  YOUR PLAN:  -ASTHMA: Asthma is a condition where your airways narrow and swell, making it difficult to breathe. Your asthma is triggered by dust, humidity, cold air, and certain chemicals. We will continue using the levobuteral nebulizer as needed and prescribe an emergency inhaler. A breathing test will be ordered to assess your lung function and confirm the asthma diagnosis. Additionally, an allergy panel will be run to identify potential triggers.  -POSTNASAL DRIP: Postnasal drip occurs when excess mucus from the nose drips down the back of the throat, causing throat clearing and dry mouth. This can be exacerbated by dry conditions and allergies. You should continue using Zyrtec as needed to manage these symptoms.   INSTRUCTIONS:  Please schedule a follow-up appointment in 3-4 months. Additionally, set up a breathing test within the next month to assess your lung function and confirm the asthma diagnosis.

## 2023-04-25 LAB — ALLERGENS W/TOTAL IGE AREA 2

## 2023-05-06 DIAGNOSIS — Z6822 Body mass index (BMI) 22.0-22.9, adult: Secondary | ICD-10-CM | POA: Diagnosis not present

## 2023-05-06 DIAGNOSIS — R051 Acute cough: Secondary | ICD-10-CM | POA: Diagnosis not present

## 2023-05-06 DIAGNOSIS — J4521 Mild intermittent asthma with (acute) exacerbation: Secondary | ICD-10-CM | POA: Diagnosis not present

## 2023-06-10 ENCOUNTER — Ambulatory Visit (HOSPITAL_BASED_OUTPATIENT_CLINIC_OR_DEPARTMENT_OTHER): Admitting: Pulmonary Disease

## 2023-06-10 DIAGNOSIS — J4520 Mild intermittent asthma, uncomplicated: Secondary | ICD-10-CM

## 2023-06-10 LAB — PULMONARY FUNCTION TEST
DL/VA % pred: 93 %
DL/VA: 3.9 ml/min/mmHg/L
DLCO cor % pred: 74 %
DLCO cor: 15.43 ml/min/mmHg
DLCO unc % pred: 74 %
DLCO unc: 15.43 ml/min/mmHg
FEF 25-75 Post: 0.42 L/s
FEF 25-75 Pre: 0.3 L/s
FEF2575-%Change-Post: 41 %
FEF2575-%Pred-Post: 18 %
FEF2575-%Pred-Pre: 13 %
FEV1-%Change-Post: 8 %
FEV1-%Pred-Post: 28 %
FEV1-%Pred-Pre: 26 %
FEV1-Post: 0.73 L
FEV1-Pre: 0.68 L
FEV1FVC-%Change-Post: -7 %
FEV1FVC-%Pred-Pre: 49 %
FEV6-%Change-Post: 15 %
FEV6-%Pred-Post: 59 %
FEV6-%Pred-Pre: 51 %
FEV6-Post: 1.9 L
FEV6-Pre: 1.65 L
FEV6FVC-%Change-Post: -1 %
FEV6FVC-%Pred-Post: 96 %
FEV6FVC-%Pred-Pre: 98 %
FVC-%Change-Post: 16 %
FVC-%Pred-Post: 61 %
FVC-%Pred-Pre: 52 %
FVC-Post: 2.05 L
FVC-Pre: 1.76 L
Post FEV1/FVC ratio: 36 %
Post FEV6/FVC ratio: 93 %
Pre FEV1/FVC ratio: 39 %
Pre FEV6/FVC Ratio: 95 %
RV % pred: 206 %
RV: 4.34 L
TLC % pred: 129 %
TLC: 6.75 L

## 2023-06-10 NOTE — Patient Instructions (Signed)
 Full PFT completed today ? ?

## 2023-06-10 NOTE — Progress Notes (Signed)
 Full PFT completed today ? ?

## 2023-06-14 MED ORDER — TRELEGY ELLIPTA 100-62.5-25 MCG/ACT IN AEPB: 1.0000 | INHALATION_SPRAY | Freq: Every day | RESPIRATORY_TRACT | 3 refills | Status: DC

## 2023-06-14 NOTE — Progress Notes (Signed)
 Pt notified sample placed out front

## 2023-06-14 NOTE — Addendum Note (Signed)
 Addended by: Amada Kingfisher on: 06/14/2023 11:38 AM   Modules accepted: Orders

## 2023-06-17 DIAGNOSIS — I10 Essential (primary) hypertension: Secondary | ICD-10-CM | POA: Diagnosis not present

## 2023-06-17 DIAGNOSIS — I2510 Atherosclerotic heart disease of native coronary artery without angina pectoris: Secondary | ICD-10-CM | POA: Diagnosis not present

## 2023-06-17 DIAGNOSIS — Z0000 Encounter for general adult medical examination without abnormal findings: Secondary | ICD-10-CM | POA: Diagnosis not present

## 2023-06-17 DIAGNOSIS — R0602 Shortness of breath: Secondary | ICD-10-CM | POA: Diagnosis not present

## 2023-06-17 DIAGNOSIS — E785 Hyperlipidemia, unspecified: Secondary | ICD-10-CM | POA: Diagnosis not present

## 2023-06-20 ENCOUNTER — Encounter (HOSPITAL_BASED_OUTPATIENT_CLINIC_OR_DEPARTMENT_OTHER): Payer: Self-pay

## 2023-06-20 ENCOUNTER — Telehealth: Payer: Self-pay | Admitting: Nurse Practitioner

## 2023-06-20 NOTE — Telephone Encounter (Signed)
 Pt states she was reading the drug pamphlet and had concerns regarding her Metoprolol and potential interactions with the Trellegy so she did additional online research and she is anxious about continuing it. Pt was unable to specify which potential SE she was concerned about just that she read that it "could be dangerous" to take both medications as she has already noted increased HR when taking this medication. Please advise pt, routing for clinic review.   Copied from CRM [id number]. Topic: Clinical - Medication Question >> Jun 20, 2023  1:07 PM Nila Nephew wrote: Reason for CRM: Patient is calling with a concern. Patient was recently prescribed Trellegy and is concerned that, per internet research, it could interact with her metoprolol. She would like clinical advisement on if this is safe or if she needs an alternative.

## 2023-06-20 NOTE — Telephone Encounter (Signed)
 Please advise on Trelegy and Metoprolol interaction, patient states she is already experiencing increased HR

## 2023-07-02 DIAGNOSIS — H2513 Age-related nuclear cataract, bilateral: Secondary | ICD-10-CM | POA: Diagnosis not present

## 2023-07-02 DIAGNOSIS — H402212 Chronic angle-closure glaucoma, right eye, moderate stage: Secondary | ICD-10-CM | POA: Diagnosis not present

## 2023-07-03 ENCOUNTER — Encounter (HOSPITAL_BASED_OUTPATIENT_CLINIC_OR_DEPARTMENT_OTHER): Payer: Self-pay | Admitting: Pulmonary Disease

## 2023-07-03 NOTE — Telephone Encounter (Signed)
 Please advise on whether she should take this with the high eye pressure

## 2023-07-03 NOTE — Research (Signed)
 72 week Follow-Up Visit Completed*   []  Not Necessary, No Potential Adverse Events Or Medication Issues Reported On Completed Subject Questionnaire   [x]  Yes, Contact With Subject/Alternate Contact Completed   []  Yes, No Contact With Subject/Alternate Contact Completed, But Electronic Health Record Was Reviewed   []  No, Unable To Contact Subject/Alternate Contact   Have you reviewed Ongoing medications on the Targeted Concomitant Medication form and updated the form as needed?   [x]  Yes   []  No   Subject Status*   [x]  Continuing In Follow-up   []  At Risk For Lost To Follow-up   []  Withdrawal From All Future Study Activities Including Passive Follow-up By Electronic Health Record Review Or Contact With Healthcare Provider Or Family Member/Friend   []  Death   Vital Status*   [x]  Alive   []  Deceased   []  Unknown   Last Known To Be Alive Source*   [x]  Subject Completed Follow-up Questionnaire/Seen In Person/Via Telephone Contact   []  Family Member or Caretaker   []  Primary Physician Or Medical Records   []  Publicly Available Source   []  Other  Date of last dose taken   not on study drug still thinking about starting  Over the last 12 weeks did the subject miss any doses?  Over the last 12 weeks did the subject restart Evolocumab  after an interruption?

## 2023-07-04 ENCOUNTER — Encounter: Payer: Self-pay | Admitting: *Deleted

## 2023-07-04 DIAGNOSIS — Z006 Encounter for examination for normal comparison and control in clinical research program: Secondary | ICD-10-CM

## 2023-07-04 NOTE — Research (Signed)
 84-WEEK Follow-Up Visit Completed*   []  Not Necessary, No Potential Adverse Events Or Medication Issues Reported On Completed Subject Questionnaire   [x]  Yes, Contact With Subject/Alternate Contact Completed   []  Yes, No Contact With Subject/Alternate Contact Completed, But Electronic Health Record Was Reviewed   []  No, Unable To Contact Subject/Alternate Contact   Have you reviewed Ongoing medications on the Targeted Concomitant Medication form and updated the form as needed?   [x]  Yes   []  No   Subject Status*   [x]  Continuing In Follow-up   []  At Risk For Lost To Follow-up   []  Withdrawal From All Future Study Activities Including Passive Follow-up By Electronic Health Record Review Or Contact With Healthcare Provider Or Family Member/Friend   []  Death   Vital Status*   [x]  Alive   []  Deceased   []  Unknown   Last Known To Be Alive Source*   [x]  Subject Completed Follow-up Questionnaire/Seen In Person/Via Telephone Contact   []  Family Member or Caretaker   []  Primary Physician Or Medical Records   []  Publicly Available Source   []  Other  Date of last dose taken   patient still wants to think about restarting drug.  Over the last 12 weeks did the subject miss any doses?  Over the last 12 weeks did the subject restart Evolocumab  after an interruption?

## 2023-07-05 ENCOUNTER — Telehealth: Payer: Self-pay

## 2023-07-05 DIAGNOSIS — H402212 Chronic angle-closure glaucoma, right eye, moderate stage: Secondary | ICD-10-CM | POA: Diagnosis not present

## 2023-07-05 DIAGNOSIS — Z01818 Encounter for other preprocedural examination: Secondary | ICD-10-CM | POA: Diagnosis not present

## 2023-07-05 NOTE — Telephone Encounter (Signed)
 Dr. Rolm Clos, please review.  I plan to set the patient up for a virtual telephone visit prior to cardiac clearance.  If needed, do you think she can temporarily come off of aspirin  prior to eye surgery?  She has a prior history of NSTEMI, however cardiac catheterization on 12/19/2021 showed 50 to 60% mid left circumflex lesion suggesting she could have closed and recannulized the left circumflex to account for her presentation.

## 2023-07-05 NOTE — Telephone Encounter (Signed)
   Pre-operative Risk Assessment    Patient Name: Mary Vance  DOB: 10-Jun-1960 MRN: [id number]   Date of last office visit: 04/30/22 Date of next office visit: Not scheduled   Request for Surgical Clearance    Procedure:   Goniosynechialysis (Glaucoma surgery)  Date of Surgery:  Clearance TBD                                Surgeon:  Not Listed Surgeon's Group or Practice Name:  [facility] Phone number:  [phone] Fax number:  [phone]   Type of Clearance Requested:   - Medical  - Pharmacy:  Hold Aspirin      Type of Anesthesia:   IV Sedation   Additional requests/questions:    Gardiner Jumper   07/05/2023, 11:43 AM

## 2023-07-08 ENCOUNTER — Telehealth: Payer: Self-pay

## 2023-07-08 NOTE — Telephone Encounter (Signed)
  Patient Consent for Virtual Visit        [name] has provided verbal consent on 07/08/2023 for a virtual visit (video or telephone).   CONSENT FOR VIRTUAL VISIT FOR:  Mary Vance  By participating in this virtual visit I agree to the following:  I hereby voluntarily request, consent and authorize [facility] and its employed or contracted physicians, physician assistants, nurse practitioners or other licensed health care professionals (the Practitioner), to provide me with telemedicine health care services (the "Services") as deemed necessary by the treating Practitioner. I acknowledge and consent to receive the Services by the Practitioner via telemedicine. I understand that the telemedicine visit will involve communicating with the Practitioner through live audiovisual communication technology and the disclosure of certain medical information by electronic transmission. I acknowledge that I have been given the opportunity to request an in-person assessment or other available alternative prior to the telemedicine visit and am voluntarily participating in the telemedicine visit.  I understand that I have the right to withhold or withdraw my consent to the use of telemedicine in the course of my care at any time, without affecting my right to future care or treatment, and that the Practitioner or I may terminate the telemedicine visit at any time. I understand that I have the right to inspect all information obtained and/or recorded in the course of the telemedicine visit and may receive copies of available information for a reasonable fee.  I understand that some of the potential risks of receiving the Services via telemedicine include:  Delay or interruption in medical evaluation due to technological equipment failure or disruption; Information transmitted may not be sufficient (e.g. poor resolution of images) to allow for appropriate medical decision making by the Practitioner;  and/or  In rare instances, security protocols could fail, causing a breach of personal health information.  Furthermore, I acknowledge that it is my responsibility to provide information about my medical history, conditions and care that is complete and accurate to the best of my ability. I acknowledge that Practitioner's advice, recommendations, and/or decision may be based on factors not within their control, such as incomplete or inaccurate data provided by me or distortions of diagnostic images or specimens that may result from electronic transmissions. I understand that the practice of medicine is not an exact science and that Practitioner makes no warranties or guarantees regarding treatment outcomes. I acknowledge that a copy of this consent can be made available to me via my patient portal [facility] MyChart), or I can request a printed copy by calling the office of [facility].    I understand that my insurance will be billed for this visit.   I have read or had this consent read to me. I understand the contents of this consent, which adequately explains the benefits and risks of the Services being provided via telemedicine.  I have been provided ample opportunity to ask questions regarding this consent and the Services and have had my questions answered to my satisfaction. I give my informed consent for the services to be provided through the use of telemedicine in my medical care

## 2023-07-08 NOTE — Telephone Encounter (Signed)
   Name: Mary Vance  DOB: 1960-06-26  MRN: [id number]  Primary Cardiologist: Oneil Bigness, MD   Preoperative team, please contact this patient and set up a phone call appointment for further preoperative risk assessment. Please obtain consent and complete medication review. Thank you for your help.  I confirm that guidance regarding antiplatelet and oral anticoagulation therapy has been completed and, if necessary, noted below.  His aspirin  may be held for 5 to 7 days prior to his procedure.  Please resume as soon as hemostasis is achieved at the discretion of the surgeon.  I also confirmed the patient resides in the state of [place] . As per [facility] telemedicine laws, the patient must reside in the state in which the provider is licensed.   Carie Charity, NP 07/08/2023, 8:08 AM [facility]

## 2023-07-08 NOTE — Telephone Encounter (Signed)
 Patient already had appointment scheduled for 5/6 for pre-op clearance.

## 2023-07-16 ENCOUNTER — Ambulatory Visit: Attending: Cardiovascular Disease | Admitting: Nurse Practitioner

## 2023-07-16 ENCOUNTER — Encounter: Payer: Self-pay | Admitting: Nurse Practitioner

## 2023-07-16 DIAGNOSIS — Z01810 Encounter for preprocedural cardiovascular examination: Secondary | ICD-10-CM | POA: Diagnosis not present

## 2023-07-16 NOTE — Progress Notes (Signed)
 Virtual Visit via Telephone Note   Because of Mary Vance co-morbid illnesses, she is at least at moderate risk for complications without adequate follow up.  This format is felt to be most appropriate for this patient at this time.  Due to technical limitations with video connection (technology), today's appointment will be conducted as an audio only telehealth visit, and Mary Vance verbally agreed to proceed in this manner.   All issues noted in this document were discussed and addressed.  No physical exam could be performed with this format.  Evaluation Performed:  Preoperative cardiovascular risk assessment _____________   Date:  07/16/2023   Patient ID:  Mary Vance, Mary Vance 1960-10-06, MRN [id number] Patient Location:  Home Provider location:   Office  Primary Care Provider:  Perley Bradley, MD Primary Cardiologist:  Oneil Bigness, MD  Chief Complaint / Patient Profile   63 y.o. y/o female with a h/o NSTEMI with medical management of moderate CAD (possible closure and recanalization of Cx), hyperlipidemia, elevated LP(a), chronic HFrEF with recovered EF, HTN, asthma who is pending goniosynechialysis and presents today for telephonic preoperative cardiovascular risk assessment.  History of Present Illness    Mary Vance is a 63 y.o. female who presents via audio/video conferencing for a telehealth visit today.  Pt was last seen in cardiology clinic on 02/14/2023 by Dr. Rolm Clos.  At that time Mary Vance was doing well.  The patient is now pending procedure as outlined above. Since her last visit, she denies chest pain, shortness of breath, lower extremity edema, fatigue, palpitations, melena, hematuria, hemoptysis, diaphoresis, weakness, presyncope, syncope, orthopnea, and PND. 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   Past Medical History     Past Medical History:  Diagnosis Date   Breast cancer (HCC) 2003   Lt breast   Cancer (HCC)    Coronary artery disease    Hypertension    Personal history of radiation therapy    Past Surgical History:  Procedure Laterality Date   BACK SURGERY  1994   BREAST LUMPECTOMY Left 2003   LEFT HEART CATH AND CORONARY ANGIOGRAPHY N/A 12/19/2021   Procedure: LEFT HEART CATH AND CORONARY ANGIOGRAPHY;  Surgeon: Arty Binning, MD;  Location: [facility];  Service: Cardiovascular;  Laterality: N/A;   OOPHORECTOMY N/A     Allergies  Allergies  Allergen Reactions   Amoxicillin Swelling    Swelling of throat    Avelox [Moxifloxacin] Other (See Comments)    "Deathly sick"    Celexa [Citalopram Hydrobromide] Other (See Comments)    Nightmares    Codeine Other (See Comments)    Gi upset    Doxycycline Other (See Comments)    Reflux    Keflex [Cephalexin] Other (See Comments)    Unknown    Penicillins Other (See Comments)    Childhood reaction. "About died when I was a baby"     Home Medications    Prior to Admission medications   Medication Sig Start Date End Date Taking? Authorizing Provider  acetaminophen  (TYLENOL ) 500 MG tablet Take 1,000 mg by mouth 2 (two) times daily as needed (pain).    [provider]  ALPRAZolam  (XANAX ) 1 MG tablet Take 0.5 mg by mouth daily as needed for anxiety or sleep. 09/25/21   [provider]  aspirin  81 MG chewable tablet Chew 1  tablet (81 mg total) by mouth daily. 12/22/21   Debria Fang, PA-C  atorvastatin  (LIPITOR) 80 MG tablet TAKE 1 TABLET BY MOUTH EVERY DAY 11/26/22   Debria Fang, PA-C  CALCIUM  PO Take by mouth.    [provider]  cetirizine (ZYRTEC) 10 MG tablet Take 10 mg by mouth daily.    [provider]  clobetasol ointment (TEMOVATE) 0.05 % Apply 1 Application topically 3 (three) times a week. 09/25/21   [provider]  famotidine  (PEPCID ) 20 MG tablet Take 20 mg by  mouth 2 (two) times daily.    [provider]  Fluticasone-Umeclidin-Vilant (TRELEGY ELLIPTA ) 100-62.5-25 MCG/ACT AEPB Inhale 1 puff into the lungs daily. 06/14/23   Lind Repine, MD  levalbuterol  (XOPENEX ) 0.63 MG/3ML nebulizer solution Use 1 vial (0.63 mg total) by nebulization every 8 (eight) hours as needed for wheezing or shortness of breath. Patient not taking: Reported on 07/08/2023 12/21/21   Debria Fang, PA-C  metoprolol  succinate (TOPROL -XL) 50 MG 24 hr tablet Take 0.5 tablets (25 mg total) by mouth daily. Take with or immediately following a meal. 07/30/22   O'Neal, Cathay Clonts, MD  Multiple Vitamins-Minerals (MULTI ADULT GUMMIES) CHEW Chew 2 tablets by mouth daily.    [provider]  nitroGLYCERIN  (NITROSTAT ) 0.4 MG SL tablet Place 1 tablet (0.4 mg total) under the tongue every 5 (five) minutes x 3 doses as needed for chest pain. 07/30/22   O'NealCathay Clonts, MD  Probiotic Product (PROBIOTIC GUMMIES PO) Take 2 tablets by mouth daily.    [provider]  SALINE NASAL SPRAY NA Place 1 spray into the nose daily as needed (headache/dryness).    [provider]    Physical Exam    Vital Signs:  Mary Vance does not have vital signs available for review today.  Given telephonic nature of communication, physical exam is limited. AAOx3. NAD. Normal affect.  Speech and respirations are unlabored.  Accessory Clinical Findings    None  Assessment & Plan    1.  Preoperative Cardiovascular Risk Assessment: According to the Revised Cardiac Risk Index (RCRI), her Perioperative Risk of Major Cardiac Event is (%): 6.6. Her Functional Capacity in METs is: 8.97 according to the Duke Activity Status Index (DASI). The patient is doing well from a cardiac perspective. Therefore, based on ACC/AHA guidelines, the patient would be at acceptable risk for the planned procedure without further cardiovascular testing.   The patient was advised that if she  develops new symptoms prior to surgery to contact our office to arrange for a follow-up visit, and she verbalized understanding.  Per office protocol, she may hold aspirin  for 5-7 days prior to procedure and should resume as soon as hemodynamically stable postoperatively.  A copy of this note will be routed to requesting surgeon.  Time:   Today, I have spent 10 minutes with the patient with telehealth technology discussing medical history, symptoms, and management plan.    Gerldine Koch, NP-C  07/16/2023, 2:45 PM [address], [address] [place], [place] [address] Office [phone] Fax [phone]

## 2023-07-22 DIAGNOSIS — H25811 Combined forms of age-related cataract, right eye: Secondary | ICD-10-CM | POA: Diagnosis not present

## 2023-07-22 DIAGNOSIS — I10 Essential (primary) hypertension: Secondary | ICD-10-CM | POA: Diagnosis not present

## 2023-07-22 DIAGNOSIS — H402212 Chronic angle-closure glaucoma, right eye, moderate stage: Secondary | ICD-10-CM | POA: Diagnosis not present

## 2023-07-22 DIAGNOSIS — F419 Anxiety disorder, unspecified: Secondary | ICD-10-CM | POA: Diagnosis not present

## 2023-07-22 NOTE — Progress Notes (Deleted)
 @Patient  ID: Mary Vance, female    DOB: 07/18/1960, 63 y.o.   MRN: [id number]  No chief complaint on file.   Referring provider: Perley Bradley, MD  HPI: 63 year old female, former smoker followed for asthma. She is a patient of Dr. Hortense Vance and last seen in office 04/23/2023. Past medical history significant for HTN, ischemic cardiomyopathy, NSTEMI, HLD, breast cancer.   TEST/EVENTS:  12/18/2021 CXR: no active disease 04/23/2023 RAST negative; IgE nl; eos 300  06/10/2023 PFT: FVC 52, FEV1 26, ratio 36, TLC 129, DLCO 74. Very severe obstructive lung disease with moderate diffusion defect.   04/23/2023: OV with Dr. Villa Vance. Referred by cardiology. Former smoker. Intermittent SOB. More pronounced at night, during physical exertion and in certain environmental conditions. Cold weather, high humidity, dust, exposure to cleaning products, and fragrance exacerbate her symptoms. Also worse after eating jalapeno peppers. Using levalbuterol  which is helpful. 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 She has two dogs at home. PFT ordered. Allergy panel ordred.     Allergies  Allergen Reactions   Amoxicillin Swelling    Swelling of throat    Avelox [Moxifloxacin] Other (See Comments)    "Deathly sick"    Celexa [Citalopram Hydrobromide] Other (See Comments)    Nightmares    Codeine Other (See Comments)    Gi upset    Doxycycline Other (See Comments)    Reflux    Keflex [Cephalexin] Other (See Comments)    Unknown    Penicillins Other (See Comments)    Childhood reaction. "About died when I was a baby"      There is no immunization history on file for this patient.  Past Medical History:  Diagnosis Date   Breast cancer (HCC) 2003   Lt breast   Cancer (HCC)    Coronary artery disease    Hypertension    Personal history of radiation therapy     Tobacco History: Social History   Tobacco Use  Smoking Status  Former   Current packs/day: 0.00   Types: Cigarettes   Quit date: 03/2001   Years since quitting: 22.3  Smokeless Tobacco Never  Tobacco Comments   Quit around 2000 after breast cancer diagnosis   Counseling given: Not Answered Tobacco comments: Quit around 2000 after breast cancer diagnosis   Outpatient Medications Prior to Visit  Medication Sig Dispense Refill   acetaminophen  (TYLENOL ) 500 MG tablet Take 1,000 mg by mouth 2 (two) times daily as needed (pain).     ALPRAZolam  (XANAX ) 1 MG tablet Take 0.5 mg by mouth daily as needed for anxiety or sleep.     aspirin  81 MG chewable tablet Chew 1 tablet (81 mg total) by mouth daily. 90 tablet 3   atorvastatin  (LIPITOR) 80 MG tablet TAKE 1 TABLET BY MOUTH EVERY DAY 90 tablet 2   CALCIUM  PO Take by mouth.     cetirizine (ZYRTEC) 10 MG tablet Take 10 mg by mouth daily.     clobetasol ointment (TEMOVATE) 0.05 % Apply 1 Application topically 3 (three) times a week.     famotidine  (PEPCID ) 20 MG tablet Take 20 mg by mouth 2 (two) times daily.     Fluticasone-Umeclidin-Vilant (TRELEGY ELLIPTA ) 100-62.5-25 MCG/ACT AEPB Inhale 1 puff into the lungs daily. 28 each 3   levalbuterol  (XOPENEX ) 0.63 MG/3ML nebulizer solution Use 1 vial (0.63 mg total) by nebulization every 8 (eight) hours as needed for wheezing or shortness of breath. (  Patient not taking: Reported on 07/08/2023) 75 mL 12   metoprolol  succinate (TOPROL -XL) 50 MG 24 hr tablet Take 0.5 tablets (25 mg total) by mouth daily. Take with or immediately following a meal. 45 tablet 3   Multiple Vitamins-Minerals (MULTI ADULT GUMMIES) CHEW Chew 2 tablets by mouth daily.     nitroGLYCERIN  (NITROSTAT ) 0.4 MG SL tablet Place 1 tablet (0.4 mg total) under the tongue every 5 (five) minutes x 3 doses as needed for chest pain. 25 tablet 12   Probiotic Product (PROBIOTIC GUMMIES PO) Take 2 tablets by mouth daily.     SALINE NASAL SPRAY NA Place 1 spray into the nose daily as needed (headache/dryness).      No facility-administered medications prior to visit.     Review of Systems:   Constitutional: No weight loss or gain, night sweats, fevers, chills, fatigue, or lassitude. HEENT: No headaches, difficulty swallowing, tooth/dental problems, or sore throat. No sneezing, itching, ear ache, nasal congestion, or post nasal drip CV:  No chest pain, orthopnea, PND, swelling in lower extremities, anasarca, dizziness, palpitations, syncope Resp: No shortness of breath with exertion or at rest. No excess mucus or change in color of mucus. No productive or non-productive. No hemoptysis. No wheezing.  No chest wall deformity GI:  No heartburn, indigestion, abdominal pain, nausea, vomiting, diarrhea, change in bowel habits, loss of appetite, bloody stools.  GU: No dysuria, change in color of urine, urgency or frequency.  No flank pain, no hematuria  Skin: No rash, lesions, ulcerations MSK:  No joint pain or swelling.  No decreased range of motion.  No back pain. Neuro: No dizziness or lightheadedness.  Psych: No depression or anxiety. Mood stable.     Physical Exam:  There were no vitals taken for this visit.  GEN: Pleasant, interactive, well-nourished/chronically-ill appearing/acutely-ill appearing/poorly-nourished/morbidly obese; in no acute distress.****** HEENT:  Normocephalic and atraumatic. EACs patent bilaterally. TM pearly gray with present light reflex bilaterally. PERRLA. Sclera white. Nasal turbinates pink, moist and patent bilaterally. No rhinorrhea present. Oropharynx pink and moist, without exudate or edema. No lesions, ulcerations, or postnasal drip.  NECK:  Supple w/ fair ROM. No JVD present. Normal carotid impulses w/o bruits. Thyroid symmetrical with no goiter or nodules palpated. No lymphadenopathy.   CV: RRR, no m/r/g, no peripheral edema. Pulses intact, +2 bilaterally. No cyanosis, pallor or clubbing. PULMONARY:  Unlabored, regular breathing. Clear bilaterally A&P w/o  wheezes/rales/rhonchi. No accessory muscle use.  GI: BS present and normoactive. Soft, non-tender to palpation. No organomegaly or masses detected. No CVA tenderness. MSK: No erythema, warmth or tenderness. Cap refil <2 sec all extrem. No deformities or joint swelling noted.  Neuro: A/Ox3. No focal deficits noted.   Skin: Warm, no lesions or rashe Psych: Normal affect and behavior. Judgement and thought content appropriate.     Lab Results:  CBC    Component Value Date/Time   WBC 5.5 04/23/2023 1557   WBC 6.7 12/21/2021 0144   RBC 4.75 04/23/2023 1557   RBC 4.58 12/21/2021 0144   HGB 15.3 04/23/2023 1557   HCT 44.7 04/23/2023 1557   PLT 159 04/23/2023 1557   MCV 94 04/23/2023 1557   MCH 32.2 04/23/2023 1557   MCH 32.3 12/21/2021 0144   MCHC 34.2 04/23/2023 1557   MCHC 34.7 12/21/2021 0144   RDW 11.8 04/23/2023 1557   LYMPHSABS 1.9 04/23/2023 1557   EOSABS 0.3 04/23/2023 1557   BASOSABS 0.1 04/23/2023 1557    BMET    Component Value Date/Time  NA 137 12/27/2021 1043   K 3.5 12/27/2021 1043   CL 102 12/27/2021 1043   CO2 29 12/27/2021 1043   GLUCOSE 109 (H) 12/27/2021 1043   BUN 9 12/27/2021 1043   CREATININE 0.83 12/27/2021 1043   CALCIUM  8.6 (L) 12/27/2021 1043   GFRNONAA >60 12/27/2021 1043    BNP    Component Value Date/Time   BNP 450.9 (H) 12/20/2021 0752     Imaging:  No results found.  Administration History     None          Latest Ref Rng & Units 06/10/2023    1:20 PM  PFT Results  FVC-Pre L 1.76   FVC-Predicted Pre % 52   FVC-Post L 2.05   FVC-Predicted Post % 61   Pre FEV1/FVC % % 39   Post FEV1/FCV % % 36   FEV1-Pre L 0.68   FEV1-Predicted Pre % 26   FEV1-Post L 0.73   DLCO uncorrected ml/min/mmHg 15.43   DLCO UNC% % 74   DLCO corrected ml/min/mmHg 15.43   DLCO COR %Predicted % 74   DLVA Predicted % 93   TLC L 6.75   TLC % Predicted % 129   RV % Predicted % 206     No results found for:  "NITRICOXIDE"      Assessment & Plan:   No problem-specific Assessment & Plan notes found for this encounter.   Advised if symptoms do not improve or worsen, to please contact office for sooner follow up or seek emergency care.   I spent *** minutes of dedicated to the care of this patient on the date of this encounter to include pre-visit review of records, face-to-face time with the patient discussing conditions above, post visit ordering of testing, clinical documentation with the electronic health record, making appropriate referrals as documented, and communicating necessary findings to members of the patients care team.  Roetta Clarke, NP 07/22/2023  Pt aware and understands NP's role.

## 2023-07-23 ENCOUNTER — Ambulatory Visit (HOSPITAL_BASED_OUTPATIENT_CLINIC_OR_DEPARTMENT_OTHER): Admitting: Nurse Practitioner

## 2023-08-12 ENCOUNTER — Other Ambulatory Visit: Payer: Self-pay | Admitting: Cardiovascular Disease

## 2023-08-15 ENCOUNTER — Other Ambulatory Visit: Payer: Self-pay | Admitting: Cardiology

## 2023-09-02 DIAGNOSIS — H268 Other specified cataract: Secondary | ICD-10-CM | POA: Diagnosis not present

## 2023-09-02 DIAGNOSIS — H25812 Combined forms of age-related cataract, left eye: Secondary | ICD-10-CM | POA: Diagnosis not present

## 2023-09-02 DIAGNOSIS — H40062 Primary angle closure without glaucoma damage, left eye: Secondary | ICD-10-CM | POA: Diagnosis not present

## 2023-09-02 DIAGNOSIS — F419 Anxiety disorder, unspecified: Secondary | ICD-10-CM | POA: Diagnosis not present

## 2023-09-04 ENCOUNTER — Ambulatory Visit (HOSPITAL_BASED_OUTPATIENT_CLINIC_OR_DEPARTMENT_OTHER): Admitting: Nurse Practitioner

## 2023-09-10 ENCOUNTER — Encounter: Payer: Self-pay | Admitting: Nurse Practitioner

## 2023-09-10 ENCOUNTER — Ambulatory Visit: Admitting: Nurse Practitioner

## 2023-09-10 VITALS — BP 112/82 | HR 78 | Ht 66.0 in | Wt 145.2 lb

## 2023-09-10 DIAGNOSIS — R0609 Other forms of dyspnea: Secondary | ICD-10-CM

## 2023-09-10 DIAGNOSIS — J449 Chronic obstructive pulmonary disease, unspecified: Secondary | ICD-10-CM

## 2023-09-10 DIAGNOSIS — Z87891 Personal history of nicotine dependence: Secondary | ICD-10-CM | POA: Diagnosis not present

## 2023-09-10 NOTE — Patient Instructions (Addendum)
 Start Trelegy 1 puff daily. Brush tongue and rinse mouth afterwards Continue Albuterol inhaler 2 puffs every 6 hours as needed for shortness of breath or wheezing. Notify if symptoms persist despite rescue inhaler/neb use.  Continue allergy regimen as needed for allergies  CT chest ordered  Follow up in 6-8 weeks after CT scan and to see how Trelegy is working with Dr. Jude or Izetta Malachy PIETY. If symptoms do not improve or worsen, please contact office for sooner follow up or seek emergency care.

## 2023-09-10 NOTE — Progress Notes (Unsigned)
 @Patient  ID: Mary Vance, female    DOB: [birth year], 63 y.o.   MRN: [id number]  Chief Complaint  Patient presents with   Follow-up    PFT follow-up    Referring provider: Cleotilde Planas, MD  HPI: 63 year old female, former smoker followed for asthma/COPD overlap. She is a patient of Dr. Cyndi and last seen in office 04/23/2023. Past medical history significant for NSTEMI, ischemic cardiomyopathy, HTN, HLD.   TEST/EVENTS:  12/18/2021 CXR: lung clear  06/10/2023 PFT: FVC 52, FEV1 26, ratio 36, TLC 129, DLCOcor 74. Positive bronchodilator response in FVC and mid flow reversibility. 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  04/23/2023: OV with Dr. Jude. Intermittent asthma with triggers including dust, humidity, cold air, certain chemicals. Wheezing, SOB, nocturnal or exercise induced exacerbations. Symptoms resurfaced post radiation therapy for breast cancer in 2003. Managed with PRN SABA. PFT ordered. Started on Trelegy.   09/12/2023: Today - follow up Discussed the use of AI scribe software for clinical note transcription with the patient, who gave verbal consent to proceed. Pt presents today for follow up. She had PFT that showed very severe COPD with bronchodilator response. She is accompanied by her husband.   She experiences shortness of breath, particularly when walking, climbing stairs, or in hot and humid conditions. She has not been consistently using her prescribed Trelegy inhaler due to concerns for side effects. No current cough or wheezing, although she was recently sick after a trip to the beach but is feeling better. No steroids/abx used. She uses levalbuterol  as a rescue inhaler usually once a day or so.   Her past medical history includes asthma and radiation therapy for breast cancer, which may have contributed to lung changes. She also has a history of a heart attack and ischemic cardiomyopathy. Follows with cardiology. No CP, syncope, palpitations.   She has a  history of smoking, having quit in 2003 after smoking a pack a day for many years. 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 No restriction on PFT. No prior CT chest imaging on record.   Allergies  Allergen Reactions   Amoxicillin Swelling    Swelling of throat    Avelox [Moxifloxacin] Other (See Comments)    Deathly sick    Celexa [Citalopram Hydrobromide] Other (See Comments)    Nightmares    Codeine Other (See Comments)    Gi upset    Doxycycline Other (See Comments)    Reflux    Keflex [Cephalexin] Other (See Comments)    Unknown    Penicillins Other (See Comments)    Childhood reaction. About died when I was a baby      There is no immunization history on file for this patient.  Past Medical History:  Diagnosis Date   Breast cancer (HCC) 2003   Lt breast   Cancer (HCC)    Coronary artery disease    Hypertension    Personal history of radiation therapy     Tobacco History: Social History   Tobacco Use  Smoking Status Former   Current packs/day: 0.00   Types: Cigarettes   Quit date: 03/2001   Years since quitting: 22.5  Smokeless Tobacco Never  Tobacco Comments   Quit around 2000 after breast cancer diagnosis   Counseling given: Not Answered Tobacco comments: Quit around 2000 after breast cancer diagnosis   Outpatient Medications Prior to Visit  Medication Sig Dispense Refill   acetaminophen  (TYLENOL )  500 MG tablet Take 1,000 mg by mouth 2 (two) times daily as needed (pain).     ALPRAZolam  (XANAX ) 1 MG tablet Take 0.5 mg by mouth daily as needed for anxiety or sleep.     aspirin  81 MG chewable tablet Chew 1 tablet (81 mg total) by mouth daily. 90 tablet 3   atorvastatin  (LIPITOR) 80 MG tablet TAKE 1 TABLET BY MOUTH EVERY DAY 90 tablet 2   CALCIUM  PO Take by mouth.     cetirizine (ZYRTEC) 10 MG tablet Take 10 mg by mouth daily.     clobetasol ointment (TEMOVATE) 0.05 % Apply  1 Application topically 3 (three) times a week.     famotidine  (PEPCID ) 20 MG tablet Take 20 mg by mouth 2 (two) times daily.     Fluticasone-Umeclidin-Vilant (TRELEGY ELLIPTA ) 100-62.5-25 MCG/ACT AEPB Inhale 1 puff into the lungs daily. 28 each 3   metoprolol  succinate (TOPROL -XL) 50 MG 24 hr tablet TAKE 0.5 TABLETS (25 MG TOTAL) BY MOUTH DAILY. TAKE WITH OR IMMEDIATELY FOLLOWING A MEAL. 45 tablet 3   Multiple Vitamins-Minerals (MULTI ADULT GUMMIES) CHEW Chew 2 tablets by mouth daily.     nitroGLYCERIN  (NITROSTAT ) 0.4 MG SL tablet Place 1 tablet (0.4 mg total) under the tongue every 5 (five) minutes x 3 doses as needed for chest pain. 25 tablet 12   Probiotic Product (PROBIOTIC GUMMIES PO) Take 2 tablets by mouth daily.     SALINE NASAL SPRAY NA Place 1 spray into the nose daily as needed (headache/dryness).     levalbuterol  (XOPENEX ) 0.63 MG/3ML nebulizer solution Use 1 vial (0.63 mg total) by nebulization every 8 (eight) hours as needed for wheezing or shortness of breath. (Patient not taking: Reported on 09/10/2023) 75 mL 12   No facility-administered medications prior to visit.     Review of Systems:   Constitutional: No weight loss or gain, night sweats, fevers, chills, or lassitude. +baseline fatigue  HEENT: No headaches, difficulty swallowing, tooth/dental problems, or sore throat. No sneezing, itching, ear ache, nasal congestion, or post nasal drip CV:  No chest pain, orthopnea, PND, swelling in lower extremities, anasarca, dizziness, palpitations, syncope Resp: + shortness of breath with exertion. No excess mucus or change in color of mucus. No productive or non-productive. No hemoptysis. No wheezing.  No chest wall deformity GI:  No heartburn, indigestion, abdominal pain, nausea, vomiting, diarrhea, change in bowel habits, loss of appetite, bloody stools.  GU: No dysuria, change in color of urine, urgency or frequency.  No flank pain, no hematuria  Skin: No rash, lesions,  ulcerations MSK:  No joint pain or swelling.   Neuro: No dizziness or lightheadedness.  Psych: No depression or anxiety. Mood stable.     Physical Exam:  BP 112/82 (BP Location: Left Arm, Patient Position: Sitting, Cuff Size: Normal)   Pulse 78   Ht 5' 6 (1.676 m)   Wt 145 lb 3.2 oz (65.9 kg)   SpO2 97%   BMI 23.44 kg/m   GEN: Pleasant, interactive, well-appearing; in no acute distress HEENT:  Normocephalic and atraumatic. PERRLA. Sclera white. Nasal turbinates pink, moist and patent bilaterally. No rhinorrhea present. Oropharynx pink and moist, without exudate or edema. No lesions, ulcerations, or postnasal drip.  NECK:  Supple w/ fair ROM. No JVD present. Normal carotid impulses w/o bruits. Thyroid symmetrical with no goiter or nodules palpated. No lymphadenopathy.   CV: RRR, no m/r/g, no peripheral edema. Pulses intact, +2 bilaterally. No cyanosis, pallor or clubbing. PULMONARY:  Unlabored, regular  breathing. Mild bronchospasm b/l lower lobes otherwise clear bilaterally A&P. No accessory muscle use.  GI: BS present and normoactive. Soft, non-tender to palpation. No organomegaly or masses detected. MSK: No erythema, warmth or tenderness. Cap refil <2 sec all extrem. No deformities or joint swelling noted.  Neuro: A/Ox3. No focal deficits noted.   Skin: Warm, no lesions or rashe Psych: Normal affect and behavior. Judgement and thought content appropriate.     Lab Results:  CBC    Component Value Date/Time   WBC 5.5 04/23/2023 1557   WBC 6.7 12/21/2021 0144   RBC 4.75 04/23/2023 1557   RBC 4.58 12/21/2021 0144   HGB 15.3 04/23/2023 1557   HCT 44.7 04/23/2023 1557   PLT 159 04/23/2023 1557   MCV 94 04/23/2023 1557   MCH 32.2 04/23/2023 1557   MCH 32.3 12/21/2021 0144   MCHC 34.2 04/23/2023 1557   MCHC 34.7 12/21/2021 0144   RDW 11.8 04/23/2023 1557   LYMPHSABS 1.9 04/23/2023 1557   EOSABS 0.3 04/23/2023 1557   BASOSABS 0.1 04/23/2023 1557    BMET    Component  Value Date/Time   NA 137 12/27/2021 1043   K 3.5 12/27/2021 1043   CL 102 12/27/2021 1043   CO2 29 12/27/2021 1043   GLUCOSE 109 (H) 12/27/2021 1043   BUN 9 12/27/2021 1043   CREATININE 0.83 12/27/2021 1043   CALCIUM  8.6 (L) 12/27/2021 1043   GFRNONAA >60 12/27/2021 1043    BNP    Component Value Date/Time   BNP 450.9 (H) 12/20/2021 0752     Imaging:  No results found.  Administration History     None          Latest Ref Rng & Units 06/10/2023    1:20 PM  PFT Results  FVC-Pre L 1.76   FVC-Predicted Pre % 52   FVC-Post L 2.05   FVC-Predicted Post % 61   Pre FEV1/FVC % % 39   Post FEV1/FCV % % 36   FEV1-Pre L 0.68   FEV1-Predicted Pre % 26   FEV1-Post L 0.73   DLCO uncorrected ml/min/mmHg 15.43   DLCO UNC% % 74   DLCO corrected ml/min/mmHg 15.43   DLCO COR %Predicted % 74   DLVA Predicted % 93   TLC L 6.75   TLC % Predicted % 129   RV % Predicted % 206     No results found for: NITRICOXIDE      Assessment & Plan:   COPD, very severe (HCC) Very severe COPD with FEV1 28% and asthma overlap. Allergic phenotype. Moderate symptom burden. Receives benefit from SABA. Reviewed role of maintenance therapy vs rescue. Educated on side effect profile of Trelegy. Agreeable to resuming therapy. Educated on proper inhaler technique and oral hygiene following ICS. Encouraged to work on graded exercises. Trigger prevention reviewed.   Patient Instructions  Start Trelegy 1 puff daily. Brush tongue and rinse mouth afterwards Continue Albuterol inhaler 2 puffs every 6 hours as needed for shortness of breath or wheezing. Notify if symptoms persist despite rescue inhaler/neb use.  Continue allergy regimen as needed for allergies  CT chest ordered  Follow up in 6-8 weeks after CT scan and to see how Trelegy is working with Dr. Jude or Izetta Malachy PIETY. If symptoms do not improve or worsen, please contact office for sooner follow up or seek emergency care.    Former  smoker Former heavy smoker. Not a candidate for lung cancer screening due to quitting >15 years ago. She  also has chemical exposures from cleaning agents. No evidence of restriction on PFT; does have a diffusion defect. Will obtain CT chest for baseline imaging and rule out any ILD/suspicious nodules - ordered today.    Advised if symptoms do not improve or worsen, to please contact office for sooner follow up or seek emergency care.   I spent 35 minutes of dedicated to the care of this patient on the date of this encounter to include pre-visit review of records, face-to-face time with the patient discussing conditions above, post visit ordering of testing, clinical documentation with the electronic health record, making appropriate referrals as documented, and communicating necessary findings to members of the patients care team.  Comer LULLA Rouleau, NP 09/12/2023  Pt aware and understands NP's role.

## 2023-09-12 ENCOUNTER — Encounter: Payer: Self-pay | Admitting: Nurse Practitioner

## 2023-09-12 DIAGNOSIS — J449 Chronic obstructive pulmonary disease, unspecified: Secondary | ICD-10-CM | POA: Insufficient documentation

## 2023-09-12 DIAGNOSIS — Z87891 Personal history of nicotine dependence: Secondary | ICD-10-CM | POA: Insufficient documentation

## 2023-09-12 NOTE — Assessment & Plan Note (Signed)
 Very severe COPD with FEV1 28% and asthma overlap. Allergic phenotype. Moderate symptom burden. Receives benefit from SABA. Reviewed role of maintenance therapy vs rescue. Educated on side effect profile of Trelegy. Agreeable to resuming therapy. Educated on proper inhaler technique and oral hygiene following ICS. Encouraged to work on graded exercises. Trigger prevention reviewed.   Patient Instructions  Start Trelegy 1 puff daily. Brush tongue and rinse mouth afterwards Continue Albuterol inhaler 2 puffs every 6 hours as needed for shortness of breath or wheezing. Notify if symptoms persist despite rescue inhaler/neb use.  Continue allergy regimen as needed for allergies  CT chest ordered  Follow up in 6-8 weeks after CT scan and to see how Trelegy is working with Mary Vance. If symptoms do not improve or worsen, please contact office for sooner follow up or seek emergency care.

## 2023-09-12 NOTE — Assessment & Plan Note (Signed)
 Former heavy smoker. Not a candidate for lung cancer screening due to quitting >15 years ago. She also has chemical exposures from cleaning agents. No evidence of restriction on PFT; does have a diffusion defect. Will obtain CT chest for baseline imaging and rule out any ILD/suspicious nodules - ordered today.

## 2023-10-22 ENCOUNTER — Encounter: Payer: Self-pay | Admitting: *Deleted

## 2023-10-22 DIAGNOSIS — Z006 Encounter for examination for normal comparison and control in clinical research program: Secondary | ICD-10-CM

## 2023-10-22 NOTE — Research (Signed)
 Week 96 Follow-Up Visit Completed*   []  Not Necessary, No Potential Adverse Events Or Medication Issues Reported On Completed Subject Questionnaire   [x]  Yes, Contact With Subject/Alternate Contact Completed   []  Yes, No Contact With Subject/Alternate Contact Completed, But Electronic Health Record Was Reviewed   []  No, Unable To Contact Subject/Alternate Contact   Have you reviewed Ongoing medications on the Targeted Concomitant Medication form and updated the form as needed?   [x]  Yes   []  No   Subject Status*   [x]  Continuing In Follow-up   []  At Risk For Lost To Follow-up   []  Withdrawal From All Future Study Activities Including Passive Follow-up By Electronic Health Record Review Or Contact With Healthcare Provider Or Family Member/Friend   []  Death   Vital Status*   [x]  Alive   []  Deceased   []  Unknown   Last Known To Be Alive Source*   [x]  Subject Completed Follow-up Questionnaire/Seen In Person/Via Telephone Contact   []  Family Member or Caretaker   []  Primary Physician Or Medical Records   []  Publicly Available Source   []  Other  Patient still does not want to restart drug at this time.

## 2023-10-29 ENCOUNTER — Other Ambulatory Visit

## 2023-10-29 ENCOUNTER — Ambulatory Visit
Admission: RE | Admit: 2023-10-29 | Discharge: 2023-10-29 | Disposition: A | Discharge status: Home / Self Care | Source: Ambulatory Visit | Attending: Nurse Practitioner

## 2023-10-29 DIAGNOSIS — R0609 Other forms of dyspnea: Secondary | ICD-10-CM

## 2023-10-29 DIAGNOSIS — I2510 Atherosclerotic heart disease of native coronary artery without angina pectoris: Secondary | ICD-10-CM | POA: Diagnosis not present

## 2023-10-29 DIAGNOSIS — J432 Centrilobular emphysema: Secondary | ICD-10-CM | POA: Diagnosis not present

## 2023-10-29 DIAGNOSIS — Z87891 Personal history of nicotine dependence: Secondary | ICD-10-CM

## 2023-10-29 MED ORDER — IOPAMIDOL (ISOVUE-300) INJECTION 61%
75.0000 mL | Freq: Once | INTRAVENOUS | Status: AC | PRN
  Administered 2023-10-29: 75 mL via INTRAVENOUS

## 2023-11-04 ENCOUNTER — Ambulatory Visit: Payer: Self-pay | Admitting: Nurse Practitioner

## 2023-11-04 NOTE — Progress Notes (Signed)
 CT chest with plaque buildup in the arteries, mild emphysema and chronic bronchitis. No worrisome nodules. Post surgical changes. No changes to therapy. Can follow up with PCP or cards regarding the plaque buildup (very common finding)

## 2023-11-04 NOTE — Progress Notes (Signed)
 Mary Vance- the pt stated that her trelegy was over $800. She asked if spiriva would be acceptable alternative. Please advise, thanks!

## 2023-11-04 NOTE — Progress Notes (Signed)
Spoke with pt and notified of results per Katie.  Pt verbalized understanding and denied any questions. 

## 2023-11-06 ENCOUNTER — Other Ambulatory Visit (HOSPITAL_COMMUNITY): Payer: Self-pay

## 2023-11-06 ENCOUNTER — Telehealth: Payer: Self-pay | Admitting: Nurse Practitioner

## 2023-11-06 DIAGNOSIS — J449 Chronic obstructive pulmonary disease, unspecified: Secondary | ICD-10-CM

## 2023-11-06 NOTE — Telephone Encounter (Signed)
 She has very severe COPD with asthma overlap so triple therapy is recommended. Can you have pharmacy team run a test claim for inhalers? Thanks!

## 2023-11-06 NOTE — Progress Notes (Signed)
 Sent telephone note to the rx prior auth pool

## 2023-11-06 NOTE — Progress Notes (Signed)
 She has very severe COPD with asthma overlap so triple therapy is recommended. Can you have pharmacy team run a test claim for inhalers? Thanks!

## 2023-11-07 MED ORDER — BREZTRI AEROSPHERE 160-9-4.8 MCG/ACT IN AERO: 2.0000 | INHALATION_SPRAY | Freq: Two times a day (BID) | RESPIRATORY_TRACT | 11 refills | Status: AC

## 2023-11-07 NOTE — Telephone Encounter (Signed)
 Perfect, thanks for being so helpful! I have signed the rx for Breztri .  Sonny, please let the patient know and instruct her that dosing will be 2 puffs Twice daily. Clean mouth and gargle after use. Thanks!

## 2023-11-07 NOTE — Telephone Encounter (Signed)
 Dietterick, Lauraine, CPhT to Me     11/06/23  4:05 PM Per test claim Trelegy is [phone]  and Breztri  is $35.   Izetta, I went ahead and pended breztri  rx in case you want to switch her to this, thanks.

## 2023-11-07 NOTE — Telephone Encounter (Signed)
 I called and spoke with the pt and notified Breztri  has been sent to pharm and gave directions on use. She verbalized understanding. Nothing further needed.

## 2023-11-13 DIAGNOSIS — J0140 Acute pansinusitis, unspecified: Secondary | ICD-10-CM | POA: Diagnosis not present

## 2023-11-14 DIAGNOSIS — B379 Candidiasis, unspecified: Secondary | ICD-10-CM | POA: Diagnosis not present

## 2023-11-14 DIAGNOSIS — J0140 Acute pansinusitis, unspecified: Secondary | ICD-10-CM | POA: Diagnosis not present

## 2023-11-14 DIAGNOSIS — Z6822 Body mass index (BMI) 22.0-22.9, adult: Secondary | ICD-10-CM | POA: Diagnosis not present

## 2023-11-14 DIAGNOSIS — T3695XA Adverse effect of unspecified systemic antibiotic, initial encounter: Secondary | ICD-10-CM | POA: Diagnosis not present

## 2023-12-04 ENCOUNTER — Other Ambulatory Visit: Payer: Self-pay | Admitting: Family Medicine

## 2023-12-04 DIAGNOSIS — Z1231 Encounter for screening mammogram for malignant neoplasm of breast: Secondary | ICD-10-CM

## 2023-12-16 DIAGNOSIS — J449 Chronic obstructive pulmonary disease, unspecified: Secondary | ICD-10-CM | POA: Diagnosis not present

## 2023-12-16 DIAGNOSIS — I10 Essential (primary) hypertension: Secondary | ICD-10-CM | POA: Diagnosis not present

## 2023-12-16 DIAGNOSIS — J4541 Moderate persistent asthma with (acute) exacerbation: Secondary | ICD-10-CM | POA: Diagnosis not present

## 2023-12-16 DIAGNOSIS — F411 Generalized anxiety disorder: Secondary | ICD-10-CM | POA: Diagnosis not present

## 2024-01-06 ENCOUNTER — Ambulatory Visit
Admission: RE | Admit: 2024-01-06 | Discharge: 2024-01-06 | Disposition: A | Discharge status: Home / Self Care | Source: Ambulatory Visit | Attending: Family Medicine | Admitting: Family Medicine

## 2024-01-06 DIAGNOSIS — Z1231 Encounter for screening mammogram for malignant neoplasm of breast: Secondary | ICD-10-CM | POA: Diagnosis not present

## 2024-01-17 DIAGNOSIS — H402212 Chronic angle-closure glaucoma, right eye, moderate stage: Secondary | ICD-10-CM | POA: Diagnosis not present

## 2024-02-18 ENCOUNTER — Encounter: Payer: Self-pay | Admitting: *Deleted

## 2024-02-18 DIAGNOSIS — Z006 Encounter for examination for normal comparison and control in clinical research program: Secondary | ICD-10-CM

## 2024-02-18 NOTE — Research (Signed)
 WEEK 108 Follow-Up Visit Completed* Patient off drug still on the fence about restarting   []  Not Necessary, No Potential Adverse Events Or Medication Issues Reported On Completed Subject Questionnaire   [x]  Yes, Contact With Subject/Alternate Contact Completed   []  Yes, No Contact With Subject/Alternate Contact Completed, But Electronic Health Record Was Reviewed   []  No, Unable To Contact Subject/Alternate Contact   Have you reviewed Ongoing medications on the Targeted Concomitant Medication form and updated the form as needed?   [x]  Yes   []  No   Subject Status*   [x]  Continuing In Follow-up   []  At Risk For Lost To Follow-up   []  Withdrawal From All Future Study Activities Including Passive Follow-up By Electronic Health Record Review Or Contact With Healthcare Provider Or Family Member/Friend   []  Death   Vital Status*   [x]  Alive   []  Deceased   []  Unknown   Last Known To Be Alive Source*   []  Subject Completed Follow-up Questionnaire/Seen In Person/Via Telephone Contact   []  Family Member or Caretaker   []  Primary Physician Or Medical Records   []  Publicly Available Source   []  Other

## 2024-03-09 ENCOUNTER — Emergency Department (HOSPITAL_COMMUNITY)

## 2024-03-09 ENCOUNTER — Emergency Department (HOSPITAL_COMMUNITY)
Admission: EM | Admit: 2024-03-09 | Discharge: 2024-03-09 | Disposition: A | Discharge status: Home / Self Care | Attending: Emergency Medicine | Admitting: Emergency Medicine

## 2024-03-09 ENCOUNTER — Encounter (HOSPITAL_COMMUNITY): Payer: Self-pay

## 2024-03-09 ENCOUNTER — Encounter: Payer: Self-pay | Admitting: Cardiovascular Disease

## 2024-03-09 ENCOUNTER — Other Ambulatory Visit: Payer: Self-pay

## 2024-03-09 DIAGNOSIS — Z853 Personal history of malignant neoplasm of breast: Secondary | ICD-10-CM | POA: Insufficient documentation

## 2024-03-09 DIAGNOSIS — Z7982 Long term (current) use of aspirin: Secondary | ICD-10-CM | POA: Diagnosis not present

## 2024-03-09 DIAGNOSIS — Z79899 Other long term (current) drug therapy: Secondary | ICD-10-CM | POA: Insufficient documentation

## 2024-03-09 DIAGNOSIS — J449 Chronic obstructive pulmonary disease, unspecified: Secondary | ICD-10-CM | POA: Insufficient documentation

## 2024-03-09 DIAGNOSIS — I2510 Atherosclerotic heart disease of native coronary artery without angina pectoris: Secondary | ICD-10-CM | POA: Diagnosis not present

## 2024-03-09 DIAGNOSIS — I10 Essential (primary) hypertension: Secondary | ICD-10-CM | POA: Insufficient documentation

## 2024-03-09 DIAGNOSIS — Z7951 Long term (current) use of inhaled steroids: Secondary | ICD-10-CM | POA: Diagnosis not present

## 2024-03-09 DIAGNOSIS — R519 Headache, unspecified: Secondary | ICD-10-CM | POA: Diagnosis present

## 2024-03-09 LAB — BASIC METABOLIC PANEL WITH GFR
Anion gap: 12 (ref 5–15)
BUN: 12 mg/dL (ref 8–23)
CO2: 23 mmol/L (ref 22–32)
Calcium: 9.7 mg/dL (ref 8.9–10.3)
Chloride: 106 mmol/L (ref 98–111)
Creatinine, Ser: 0.96 mg/dL (ref 0.44–1.00)
GFR, Estimated: >60 mL/min
Glucose, Bld: 119 mg/dL — ABNORMAL HIGH (ref 70–99)
Potassium: 4.5 mmol/L (ref 3.5–5.1)
Sodium: 141 mmol/L (ref 135–145)

## 2024-03-09 LAB — CBC
HCT: 47.3 % — ABNORMAL HIGH (ref 36.0–46.0)
Hemoglobin: 16.1 g/dL — ABNORMAL HIGH (ref 12.0–15.0)
MCH: 32.3 pg (ref 26.0–34.0)
MCHC: 34 g/dL (ref 30.0–36.0)
MCV: 94.8 fL (ref 80.0–100.0)
Platelets: 180 K/uL (ref 150–400)
RBC: 4.99 MIL/uL (ref 3.87–5.11)
RDW: 12.2 % (ref 11.5–15.5)
WBC: 9.7 K/uL (ref 4.0–10.5)
nRBC: 0 % (ref 0.0–0.2)

## 2024-03-09 LAB — RESP PANEL BY RT-PCR (RSV, FLU A&B, COVID)  RVPGX2
Influenza A by PCR: NEGATIVE
Influenza B by PCR: NEGATIVE
Resp Syncytial Virus by PCR: NEGATIVE
SARS Coronavirus 2 by RT PCR: NEGATIVE

## 2024-03-09 LAB — TROPONIN T, HIGH SENSITIVITY
Troponin T High Sensitivity: <15 ng/L (ref 0–19)
Troponin T High Sensitivity: <15 ng/L (ref 0–19)

## 2024-03-09 MED ORDER — ONDANSETRON 4 MG PO TBDP
4.0000 mg | ORAL_TABLET | Freq: Once | ORAL | Status: AC
  Administered 2024-03-09: 4 mg via ORAL
  Filled 2024-03-09: qty 1

## 2024-03-09 MED ORDER — METOPROLOL SUCCINATE ER 50 MG PO TB24: 50.0000 mg | ORAL_TABLET | Freq: Every day | ORAL | 0 refills | Status: AC

## 2024-03-09 MED ORDER — LABETALOL HCL 5 MG/ML IV SOLN
10.0000 mg | Freq: Once | INTRAVENOUS | Status: AC
  Administered 2024-03-09: 10 mg via INTRAVENOUS
  Filled 2024-03-09: qty 4

## 2024-03-09 MED ORDER — SODIUM CHLORIDE 0.9 % IV BOLUS
500.0000 mL | Freq: Once | INTRAVENOUS | Status: AC
  Administered 2024-03-09: 500 mL via INTRAVENOUS

## 2024-03-09 MED ORDER — PROMETHAZINE HCL 25 MG PO TABS: 25.0000 mg | ORAL_TABLET | Freq: Four times a day (QID) | ORAL | 0 refills | Status: AC | PRN

## 2024-03-09 MED ORDER — SODIUM CHLORIDE 0.9 % IV SOLN
12.5000 mg | Freq: Once | INTRAVENOUS | Status: AC
  Administered 2024-03-09: 12.5 mg via INTRAVENOUS
  Filled 2024-03-09: qty 12.5

## 2024-03-09 MED ORDER — ALBUTEROL SULFATE HFA 108 (90 BASE) MCG/ACT IN AERS
1.0000 | INHALATION_SPRAY | Freq: Once | RESPIRATORY_TRACT | Status: AC
  Administered 2024-03-09: 2 via RESPIRATORY_TRACT
  Filled 2024-03-09: qty 6.7

## 2024-03-09 MED ORDER — KETOROLAC TROMETHAMINE 30 MG/ML IJ SOLN
30.0000 mg | Freq: Once | INTRAMUSCULAR | Status: AC
  Administered 2024-03-09: 30 mg via INTRAVENOUS
  Filled 2024-03-09: qty 1

## 2024-03-09 MED ORDER — AEROCHAMBER PLUS FLO-VU MEDIUM MISC
1.0000 | Freq: Once | Status: AC
  Administered 2024-03-09: 1

## 2024-03-09 MED ORDER — AMLODIPINE BESYLATE 5 MG PO TABS: 5.0000 mg | ORAL_TABLET | Freq: Every day | ORAL | 0 refills | Status: DC

## 2024-03-09 NOTE — ED Provider Notes (Signed)
 " [place] EMERGENCY DEPARTMENT AT [facility] Provider Note   CSN: [id number] Arrival date & time: 03/09/24  1200     Patient presents with: Hypertension and Chest Pain   Mary Vance is a 63 y.o. female.   Pt is a 63 yo female with pmhx significant for HTN, HLD, COPD, CAD, and Breast Cancer.  Pt presents to the ED today with nausea and headache.  She's had some cp.  She started checking her BP on 12/23 because of the headaches.  It's been elevated for several days.  She messaged her cardiologist to find out what to do, but did not hear back, so she came in.  She did take an extra half of her metoprolol  twice today (total of 50 mg).  She was given zofran  in triage, but it did not help her nausea.         Prior to Admission medications  Medication Sig Start Date End Date Taking? Authorizing Provider  amLODipine (NORVASC) 5 MG tablet Take 1 tablet (5 mg total) by mouth daily. 03/09/24  Yes Dean Clarity, MD  promethazine  (PHENERGAN ) 25 MG tablet Take 1 tablet (25 mg total) by mouth every 6 (six) hours as needed for nausea or vomiting. 03/09/24  Yes Dean Clarity, MD  acetaminophen  (TYLENOL ) 500 MG tablet Take 1,000 mg by mouth 2 (two) times daily as needed (pain).    [provider]  ALPRAZolam  (XANAX ) 1 MG tablet Take 0.5 mg by mouth daily as needed for anxiety or sleep. 09/25/21   [provider]  aspirin  81 MG chewable tablet Chew 1 tablet (81 mg total) by mouth daily. 12/22/21   Vicci Rollo SAUNDERS, PA-C  atorvastatin  (LIPITOR) 80 MG tablet TAKE 1 TABLET BY MOUTH EVERY DAY 08/15/23   O'Neal, Darryle Ned, MD  budesonide-glycopyrrolate-formoterol (BREZTRI  AEROSPHERE) 160-9-4.8 MCG/ACT AERO inhaler Inhale 2 puffs into the lungs in the morning and at bedtime. 11/07/23   Cobb, Comer GAILS, NP  CALCIUM  PO Take by mouth.    [provider]  cetirizine (ZYRTEC) 10 MG tablet Take 10 mg by mouth daily.    [provider]  clobetasol ointment  (TEMOVATE) 0.05 % Apply 1 Application topically 3 (three) times a week. 09/25/21   [provider]  famotidine  (PEPCID ) 20 MG tablet Take 20 mg by mouth 2 (two) times daily.    [provider]  levalbuterol  (XOPENEX ) 0.63 MG/3ML nebulizer solution Use 1 vial (0.63 mg total) by nebulization every 8 (eight) hours as needed for wheezing or shortness of breath. Patient not taking: Reported on 09/10/2023 12/21/21   Vicci Rollo SAUNDERS, PA-C  metoprolol  succinate (TOPROL -XL) 50 MG 24 hr tablet Take 1 tablet (50 mg total) by mouth daily. Take with or immediately following a meal. 03/09/24 04/08/24  Dean Clarity, MD  Multiple Vitamins-Minerals (MULTI ADULT GUMMIES) CHEW Chew 2 tablets by mouth daily.    [provider]  nitroGLYCERIN  (NITROSTAT ) 0.4 MG SL tablet Place 1 tablet (0.4 mg total) under the tongue every 5 (five) minutes x 3 doses as needed for chest pain. 07/30/22   O'NealDarryle Ned, MD  Probiotic Product (PROBIOTIC GUMMIES PO) Take 2 tablets by mouth daily.    [provider]  SALINE NASAL SPRAY NA Place 1 spray into the nose daily as needed (headache/dryness).    [provider]    Allergies: Amoxicillin, Avelox [moxifloxacin], Celexa [citalopram hydrobromide], Codeine, Doxycycline, Keflex [cephalexin], and Penicillins    Review of Systems  Gastrointestinal:  Positive for  nausea.  Neurological:  Positive for headaches.  All other systems reviewed and are negative.   Updated Vital Signs BP (!) 147/76   Pulse 77   Temp 97.6 F (36.4 C) (Oral)   Resp 18   SpO2 98%   Physical Exam Vitals and nursing note reviewed.  Constitutional:      Appearance: She is well-developed.  HENT:     Head: Normocephalic and atraumatic.  Eyes:     Extraocular Movements: Extraocular movements intact.     Pupils: Pupils are equal, round, and reactive to light.  Cardiovascular:     Rate and Rhythm: Normal rate and regular rhythm.     Heart sounds: Normal  heart sounds.  Pulmonary:     Effort: Pulmonary effort is normal.     Breath sounds: Normal breath sounds.  Abdominal:     General: Bowel sounds are normal.     Palpations: Abdomen is soft.  Musculoskeletal:        General: Normal range of motion.     Cervical back: Normal range of motion and neck supple.  Skin:    General: Skin is warm.     Capillary Refill: Capillary refill takes less than 2 seconds.  Neurological:     General: No focal deficit present.     Mental Status: She is alert and oriented to person, place, and time.  Psychiatric:        Mood and Affect: Mood normal.        Behavior: Behavior normal.     (all labs ordered are listed, but only abnormal results are displayed) Labs Reviewed  BASIC METABOLIC PANEL WITH GFR - Abnormal; Notable for the following components:      Result Value   Glucose, Bld 119 (*)    All other components within normal limits  CBC - Abnormal; Notable for the following components:   Hemoglobin 16.1 (*)    HCT 47.3 (*)    All other components within normal limits  RESP PANEL BY RT-PCR (RSV, FLU A&B, COVID)  RVPGX2  TROPONIN T, HIGH SENSITIVITY  TROPONIN T, HIGH SENSITIVITY    EKG: EKG Interpretation Date/Time:  Monday March 09 2024 13:48:32 EST Ventricular Rate:  97 PR Interval:  140 QRS Duration:  84 QT Interval:  348 QTC Calculation: 441 R Axis:   92  Text Interpretation: Normal sinus rhythm Right atrial enlargement Rightward axis Pulmonary disease pattern Septal infarct , age undetermined Abnormal ECG When compared with ECG of 14-Feb-2023 13:05, PREVIOUS ECG IS PRESENT Confirmed by Dean Clarity [phone]) on 03/09/2024 5:52:49 PM  Radiology: CT Head Wo Contrast Result Date: 03/09/2024 EXAM: CT HEAD WITHOUT CONTRAST 03/09/2024 06:33:56 PM TECHNIQUE: CT of the head was performed without the administration of intravenous contrast. Automated exposure control, iterative reconstruction, and/or weight based adjustment of the mA/kV  was utilized to reduce the radiation dose to as low as reasonably achievable. COMPARISON: None available. CLINICAL HISTORY: Headache, new onset (Age >= 51y). FINDINGS: BRAIN AND VENTRICLES: No acute hemorrhage. No evidence of acute infarct. No hydrocephalus. No extra-axial collection. No mass effect or midline shift. Moderate carotid siphon calcifications. ORBITS: Bilateral lens replacement. SINUSES: No acute abnormality. SOFT TISSUES AND SKULL: No acute soft tissue abnormality. No skull fracture. IMPRESSION: 1. No acute intracranial abnormality. Electronically signed by: Morgane Naveau MD 03/09/2024 07:59 PM EST RP Workstation: [id number]   DG Chest 2 View Result Date: 03/09/2024 EXAM: 2 VIEW(S) XRAY OF THE CHEST 03/09/2024 02:09:00 PM COMPARISON: CXR 05/10/2007, CT chest 10/29/2023. CLINICAL  HISTORY: cp FINDINGS: LUNGS AND PLEURA: No focal pulmonary opacity. No pleural effusion. No pneumothorax. HEART AND MEDIASTINUM: No acute abnormality of the cardiac and mediastinal silhouettes. BONES AND SOFT TISSUES: Surgical clips in left axilla. Chronic sclerotic lesion in proximal left humerus. No acute osseous abnormality. IMPRESSION: 1. No acute cardiopulmonary abnormality. Electronically signed by: Morgane Naveau MD 03/09/2024 03:23 PM EST RP Workstation: [id number]     Procedures   Medications Ordered in the ED  ondansetron  (ZOFRAN -ODT) disintegrating tablet 4 mg (4 mg Oral Given 03/09/24 1605)  promethazine  (PHENERGAN ) 12.5 mg in sodium chloride  0.9 % 50 mL IVPB (0 mg Intravenous Stopped 03/09/24 1901)  sodium chloride  0.9 % bolus 500 mL (0 mLs Intravenous Stopped 03/09/24 1936)  labetalol  (NORMODYNE ) injection 10 mg (10 mg Intravenous Given 03/09/24 1837)  ketorolac  (TORADOL ) 30 MG/ML injection 30 mg (30 mg Intravenous Given 03/09/24 2011)  albuterol  (VENTOLIN  HFA) 108 (90 Base) MCG/ACT inhaler 1-2 puff (2 puffs Inhalation Given 03/09/24 2012)  AeroChamber Plus Flo-Vu Medium MISC 1 each (1 each Other  Given 03/09/24 2013)                                    Medical Decision Making Amount and/or Complexity of Data Reviewed Labs: ordered. Radiology: ordered.  Risk Prescription drug management.   This patient presents to the ED for concern of headache, nausea, cp, this involves an extensive number of treatment options, and is a complaint that carries with it a high risk of complications and morbidity.  The differential diagnosis includes cardiac, htn, electrolyte abn, ic abn   Co morbidities that complicate the patient evaluation  HTN, HLD, COPD, CAD, and Breast Cancer   Additional history obtained:  Additional history obtained from epic chart review External records from outside source obtained and reviewed including husband   Lab Tests:  I Ordered, and personally interpreted labs.  The pertinent results include:  cbc nl, bmp nl, trop nl times 2; covid/flu/rsv   Imaging Studies ordered:  I ordered imaging studies including cxr and ct head  I independently visualized and interpreted imaging which showed CXR: No acute cardiopulmonary abnormality.  CT head: No acute intracranial abnormality.  I agree with the radiologist interpretation   Cardiac Monitoring:  The patient was maintained on a cardiac monitor.  I personally viewed and interpreted the cardiac monitored which showed an underlying rhythm of: nsr   Medicines ordered and prescription drug management:  I ordered medication including ivfs/labetalol /zofran /phenergan   for sx  Reevaluation of the patient after these medicines showed that the patient improved I have reviewed the patients home medicines and have made adjustments as needed   Test Considered:  ct   Critical Interventions:  meds  Problem List / ED Course:  HTN:  improved with labetalol .  She is told to increase her metoprolol  to 50 mg daily.  I also gave her a rx for norvasc to take as well since she took 50 mg today and it did not help  her bp.  Headache:  improved with meds and bp lowering.   Reevaluation:  After the interventions noted above, I reevaluated the patient and found that they have :improved   Social Determinants of Health:  Lives at home   Dispostion:  After consideration of the diagnostic results and the patients response to treatment, I feel that the patent would benefit from discharge with outpatient f/u.       Final  diagnoses:  Hypertension, unspecified type    ED Discharge Orders          Ordered    promethazine  (PHENERGAN ) 25 MG tablet  Every 6 hours PRN        03/09/24 2034    amLODipine (NORVASC) 5 MG tablet  Daily        03/09/24 2034    metoprolol  succinate (TOPROL -XL) 50 MG 24 hr tablet  Daily        03/09/24 2034               Dean Clarity, MD 03/09/24 2355  "

## 2024-03-09 NOTE — ED Provider Triage Note (Signed)
 Emergency Medicine Provider Triage Evaluation Note  Mary Vance , a 63 y.o. female  was evaluated in triage.  Pt complains of cp. Report for the past several days  she noticed her BP has been elevated.  Also report sob because she's afraid to use her inhaler as it raises her BP.  Endorse occasional pain to L chest at the site of her lumpectomy.  Hx of heart attack in the past  Review of Systems  Positive: As above Negative: As above  Physical Exam  BP (!) 168/105 (BP Location: Right Arm)   Pulse 93   Temp 98.4 F (36.9 C) (Oral)   Resp 20   SpO2 95%  Gen:   Awake, no distress   Resp:  Normal effort  MSK:   Moves extremities without difficulty  Other:    Medical Decision Making  Medically screening exam initiated at 1:51 PM.  Appropriate orders placed.  Mary Vance was informed that the remainder of the evaluation will be completed by another provider, this initial triage assessment does not replace that evaluation, and the importance of remaining in the ED until their evaluation is complete.     [name], PA-C 03/09/24 1352

## 2024-03-09 NOTE — ED Triage Notes (Signed)
 Patient here with complaints of high blood pressure since she got up this morning. Last BP she took at home was 177/112. She is reporting chest pain that is on the left side and radiating to armpit also she is having shob and starting to get nausea.   She has a history of a heart attack.

## 2024-03-11 ENCOUNTER — Ambulatory Visit: Attending: Student

## 2024-03-11 VITALS — BP 128/86 | HR 70 | Ht 66.0 in | Wt 142.8 lb

## 2024-03-11 DIAGNOSIS — E782 Mixed hyperlipidemia: Secondary | ICD-10-CM | POA: Diagnosis not present

## 2024-03-11 DIAGNOSIS — I5020 Unspecified systolic (congestive) heart failure: Secondary | ICD-10-CM | POA: Diagnosis not present

## 2024-03-11 DIAGNOSIS — I2510 Atherosclerotic heart disease of native coronary artery without angina pectoris: Secondary | ICD-10-CM

## 2024-03-11 DIAGNOSIS — I10 Essential (primary) hypertension: Secondary | ICD-10-CM | POA: Diagnosis not present

## 2024-03-11 NOTE — Patient Instructions (Signed)
 Medication Instructions:  TAKE Amlodipine (Norvasc) 5 mg. Take one-half (0.5) tablet, 2.5 mg for Systoilic Blood Pressure (top number) over 150 OR Diastolic Blood Pressure (bottom number) over 100.  *If you need a refill on your cardiac medications before your next appointment, please call your pharmacy*  Lab Work: None ordered If you have labs (blood work) drawn today and your tests are completely normal, you will receive your results only by: MyChart Message (if you have MyChart) OR A paper copy in the mail If you have any lab test that is abnormal or we need to change your treatment, we will call you to review the results.  Testing/Procedures: None ordered  Follow-Up: At [facility], you and your health needs are our priority.  As part of our continuing mission to provide you with exceptional heart care, our providers are all part of one team.  This team includes your primary Cardiologist (physician) and Advanced Practice Providers or APPs (Physician Assistants and Nurse Practitioners) who all work together to provide you with the care you need, when you need it.  Your next appointment:   3-4 month(s)  Provider:   Darryle ONEIDA Decent, MD    We recommend signing up for the patient portal called MyChart.  Sign up information is provided on this After Visit Summary.  MyChart is used to connect with patients for Virtual Visits (Telemedicine).  Patients are able to view lab/test results, encounter notes, upcoming appointments, etc.  Non-urgent messages can be sent to your provider as well.   To learn more about what you can do with MyChart, go to [email or web address].   Other Instructions Your physician has requested that you regularly monitor and record your blood pressure readings at home. Please use the same machine at the same time of day to check your readings and record them to bring to your follow-up visit.   Please monitor blood pressures and keep a log of your readings.     Make sure to check 2 hours after your medications.    AVOID these things for 30 minutes before checking your blood pressure: No Drinking caffeine. No Drinking alcohol. No Eating. No Smoking. No Exercising.   Five minutes before checking your blood pressure: Pee. Sit in a dining chair. Avoid sitting in a soft couch or armchair. Be quiet. Do not talk

## 2024-03-11 NOTE — Progress Notes (Signed)
 " Cardiology Office Note   Date: 03/11/2024  ID:  Mary Vance 03-06-1961 [id number] PCP: Cleotilde Planas, MD  [facility] Providers Cardiologist: Darryle ONEIDA Decent, MD     Chief Complaint: Mary Vance is a 63 y.o.female with PMH of nonobstructive CAD on LHC 12/19/2021, HFrEF with LVEF as low as 40-45% on echo 12/19/2021 that improved to 50-55% on echo from 05/02/2022, hypertension, hyperlipidemia, asthma, left breast cancer s/p lumpectomy 20 years ago who presents to the clinic for post-hospital follow-up.    Mary Vance establish care 12/2021 after hospitalization with chest discomfort.  Ruled in for NSTEMI.  Echo 12/19/2021 LVEF 40-45%, WMAs, G2DD.  Cardiac catheterization 12/19/2021 without obstructive CAD though did have 60% stenosis of LCx, recommended medical management with Plavix  and aspirin  as well as IV heparin  and nitroglycerin .  She was also placed on high-intensity statin and HF GDMT.  Repeat echo 05/02/2022 showed LVEF 50-55%, G1DD, trivial MR, tricuspid AV.  She was referred for clinical trial for Repatha  due to elevated LPA.  Presented to the ER 03/09/2024 with chest pain, nausea, headache, noted BP 177/112.  She had taken an extra dose of metoprolol  twice.  EKG with slight ST depression, troponins flat.  Amlodipine added and Toprol -XL increased. Recommended for outpatient follow-up.    History of Present Illness: Today she is doing better, encouraged by BP at visit today.  She tells me that 03/03/2024 she started having a headache and read that this could be a sign of high blood pressure.  She checked her blood pressure about 6:00 PM and it was 186/103.  She had taken Toprol -XL 25 mg that morning around 8:00.  She notes that they have done a lot of traveling recently and she has been eating out more often.  Also notes stress around planning for her upcoming cruise in the past couple of weeks.  After contacting the office regarding hypertension, she got really concerned about her BP  and decided to present to the ER.  She noted pain under her left arm that she says has always ached since her lumpectomy. She did have a significant headache but no chest pain or dyspnea. She has only had a mild intermittent headache since the ER visit. She is taking Toprol -XL in the morning and amlodipine in the evening. She is flying to and from [facility] tomorrow to get an emergent passport and then flying Sunday for her cruise that leaves for a week on Monday.  ROS: No chest pain, shortness of breath, orthopnea, PND, lower extremity edema, dizziness, lightheadedness.  Studies Reviewed: The following studies were personally reviewed today:     Cardiac Studies & Procedures   ______________________________________________________________________________________________ CARDIAC CATHETERIZATION  CARDIAC CATHETERIZATION 12/19/2021  Conclusion CONCLUSIONS: 50 to 60% mid circumflex lesion suggesting that she could have closed and recanalized the circumflex to account for her presentation. Distal LAD after large diagonal contains 40% narrowing. Tandem 20 to 30% stenoses in the mid right coronary Ostial left main less than 25% Wall motion abnormality involving the inferobasal and posterior segment.  EF is in the 40 to 50% range.  EDP is 15.  RECOMMENDATIONS: High intensity statin therapy Dual antiplatelet with aspirin  and Plavix  therapy for at least 6 months then drop aspirin .  She was given a 300 mg load in the Cath Lab. IV nitroglycerin  overnight then DC in a.m. DC IV heparin  in a.m. If recurrent similar chest pain, would need relook and potentially PCI of the circumflex. There is an outside chance that the circumflex stenosis  is an innocent bystander and that she has an atypical Takotsubo syndrome occurring under stressful circumstances (going to court for jury duty against her desire).  Findings Coronary Findings Diagnostic  Dominance: Right  Left Anterior Descending Dist LAD lesion is  40% stenosed.  Left Circumflex  First Obtuse Marginal Branch Vessel is small in size.  Third Obtuse Marginal Branch Vessel is large in size. 3rd Mrg lesion is 60% stenosed.  Right Coronary Artery Prox RCA to Mid RCA lesion is 30% stenosed.  Intervention  No interventions have been documented.     ECHOCARDIOGRAM  ECHOCARDIOGRAM COMPLETE 05/02/2022  Narrative ECHOCARDIOGRAM REPORT    Patient Name:   Mary Vance [facility] Date of Exam: 05/02/2022 Medical Rec #:  [id number]      Height:       66.0 in Accession #:    [id number]     Weight:       137.4 lb Date of Birth:  07-19-1960      BSA:          1.705 m Patient Age:    61 years       BP:           144/90 mmHg Patient Gender: F              HR:           69 bpm. Exam Location:  [address]  Procedure: 2D Echo, Cardiac Doppler and Color Doppler  Indications:    I50.22 CHF  History:        Patient has prior history of Echocardiogram examinations, most recent 12/19/2021. CHF, Previous Myocardial Infarction and CAD; Risk Factors:Hypertension, Dyslipidemia and Former Smoker.  Sonographer:    Elsie Bohr RDCS Referring Phys: [id number] [facility]   Sonographer Comments: Image acquisition challenging due to respiratory motion. IMPRESSIONS   1. Left ventricular ejection fraction, by estimation, is 50 to 55%. Left ventricular ejection fraction by 2D MOD biplane is 53.3 %. The left ventricle has low normal function. The left ventricle has no regional wall motion abnormalities. Left ventricular diastolic parameters are consistent with Grade I diastolic dysfunction (impaired relaxation). 2. Right ventricular systolic function is normal. The right ventricular size is normal. There is normal pulmonary artery systolic pressure. The estimated right ventricular systolic pressure is 22.5 mmHg. 3. The mitral valve is grossly normal. Trivial mitral valve regurgitation. 4. The aortic valve is tricuspid. Aortic valve regurgitation  is not visualized. 5. The inferior vena cava is normal in size with greater than 50% respiratory variability, suggesting right atrial pressure of 3 mmHg.  Comparison(s): Changes from prior study are noted. 12/19/2021: LVEF 40-45%, global HK.  FINDINGS Left Ventricle: Left ventricular ejection fraction, by estimation, is 50 to 55%. Left ventricular ejection fraction by 2D MOD biplane is 53.3 %. The left ventricle has low normal function. The left ventricle has no regional wall motion abnormalities. The left ventricular internal cavity size was normal in size. There is no left ventricular hypertrophy. Left ventricular diastolic parameters are consistent with Grade I diastolic dysfunction (impaired relaxation). Indeterminate filling pressures.  Right Ventricle: The right ventricular size is normal. No increase in right ventricular wall thickness. Right ventricular systolic function is normal. There is normal pulmonary artery systolic pressure. The tricuspid regurgitant velocity is 2.21 m/s, and with an assumed right atrial pressure of 3 mmHg, the estimated right ventricular systolic pressure is 22.5 mmHg.  Left Atrium: Left atrial size was normal in size.  Right Atrium: Right atrial size was  normal in size.  Pericardium: There is no evidence of pericardial effusion.  Mitral Valve: The mitral valve is grossly normal. Trivial mitral valve regurgitation.  Tricuspid Valve: The tricuspid valve is grossly normal. Tricuspid valve regurgitation is trivial.  Aortic Valve: The aortic valve is tricuspid. Aortic valve regurgitation is not visualized.  Pulmonic Valve: The pulmonic valve was normal in structure. Pulmonic valve regurgitation is not visualized.  Aorta: The aortic root and ascending aorta are structurally normal, with no evidence of dilitation.  Venous: The inferior vena cava is normal in size with greater than 50% respiratory variability, suggesting right atrial pressure of 3  mmHg.  IAS/Shunts: No atrial level shunt detected by color flow Doppler.   LEFT VENTRICLE PLAX 2D                        Biplane EF (MOD) LVIDd:         4.30 cm         LV Biplane EF:   Left LVIDs:         3.00 cm                          ventricular LV PW:         0.90 cm                          ejection LV IVS:        0.90 cm                          fraction by LVOT diam:     2.10 cm                          2D MOD LV SV:         58                               biplane is LV SV Index:   34                               53.3 %. LVOT Area:     3.46 cm Diastology LV e' medial:    10.70 cm/s LV Volumes (MOD)               LV E/e' medial:  6.2 LV vol d, MOD    81.2 ml       LV e' lateral:   17.50 cm/s A2C:                           LV E/e' lateral: 3.8 LV vol d, MOD    75.0 ml A4C: LV vol s, MOD    36.7 ml A2C: LV vol s, MOD    35.4 ml A4C: LV SV MOD A2C:   44.5 ml LV SV MOD A4C:   75.0 ml LV SV MOD BP:    42.0 ml  RIGHT VENTRICLE             IVC RV S prime:     12.00 cm/s  IVC diam: 0.90 cm TAPSE (M-mode): 1.7 cm RVSP:           22.5 mmHg  LEFT ATRIUM             Index        RIGHT ATRIUM           Index LA diam:        2.90 cm 1.70 cm/m   RA Pressure: 3.00 mmHg LA Vol (A2C):   28.4 ml 16.66 ml/m  RA Area:     11.90 cm LA Vol (A4C):   16.9 ml 9.91 ml/m   RA Volume:   27.90 ml  16.36 ml/m LA Biplane Vol: 21.9 ml 12.85 ml/m AORTIC VALVE LVOT Vmax:   79.00 cm/s LVOT Vmean:  51.300 cm/s LVOT VTI:    0.168 m  AORTA Ao Root diam: 3.20 cm Ao Asc diam:  3.20 cm  MITRAL VALVE               TRICUSPID VALVE MV Area (PHT): 2.84 cm    TR Peak grad:   19.5 mmHg MV Decel Time: 267 msec    TR Vmax:        221.00 cm/s MV E velocity: 66.00 cm/s  Estimated RAP:  3.00 mmHg MV A velocity: 73.90 cm/s  RVSP:           22.5 mmHg MV E/A ratio:  0.89 SHUNTS Systemic VTI:  0.17 m Systemic Diam: 2.10 cm  Vinie Maxcy MD Electronically signed by Vinie Maxcy MD Signature  Date/Time: 05/02/2022/4:42:05 PM    Final          ______________________________________________________________________________________________                  Physical Exam: VS: BP 128/86   Pulse 70   Ht 5' 6 (1.676 m)   Wt 142 lb 12.8 oz (64.8 kg)   SpO2 98%   BMI 23.05 kg/m    GEN: Well nourished, in NAD HEENT: Normal NECK: No JVD CARDIAC: RRR, no murmurs, rubs, gallops RESPIRATORY: Clear to auscultation bilaterally ABDOMEN: Soft, non-tender, non-distended MUSCULOSKELETAL: No edema SKIN: Warm and dry NEUROLOGIC:  Alert and oriented x 3 PSYCHIATRIC:  Anxious   Assessment & Plan: 1. Hypertension: BP today 128/86, at home 120s-140s/80s since ER visit. Significantly improved from checks prior to ER visit. She denies headache, vision changes, chest pain today. - She will log her BP 2 hours after taking Toprol -XL in the morning for 2 weeks and send me the results in MyChart - Continue amlodipine 5 mg daily - advised her that she can take an extra half tablet for BP>150/100 during her cruise - consider increasing maintenance dose if BP not at goal after she returns - Continue Toprol -XL 50 mg daily  2. CAD: NSTEMI 12/19/2021 with LHC showing 60% LCx and otherwise nonobstructive disease.  Medical management recommended.  ER visit 03/09/2024 troponins negative, mild ST changes on EKG. Denies repeat EKG today given no anginal symptoms. - Continue aspirin  81 mg daily - Continue Toprol -XL 50 mg daily - Continue atorvastatin  80 mg daily - Continue as needed SL nitroglycerin   3. HFrEF: LVEF on echo after NSTEMI 12/19/2021 was 40-45%.  This was improved to 50-55% on most recent echo 05/02/2022.  She denies any heart failure symptoms today.  Appears euvolemic on exam. - Continue Toprol -XL 50 mg daily - Recommend daily weights and limited salt/fluid intake  4. Hyperlipidemia: 06/17/2023 LDL 79, HDL 62, TGs 74, total 156, ALT 20.  Followed by PCP. - Continue atorvastatin  80 mg  daily   Dispo: Follow-up with Dr. Barbaraann or APP in 3-4 months.  Signed, Saddie GORMAN Cleaves, NP 03/11/2024 1:09 PM [facility] "

## 2024-04-07 ENCOUNTER — Telehealth: Payer: Self-pay | Admitting: Cardiovascular Disease

## 2024-04-07 MED ORDER — AMLODIPINE BESYLATE 5 MG PO TABS: ORAL_TABLET | ORAL | 3 refills | Status: AC

## 2024-04-07 NOTE — Telephone Encounter (Signed)
" °*  STAT* If patient is at the pharmacy, call can be transferred to refill team.   1. Which medications need to be refilled? (please list name of each medication and dose if known)   amLODipine  (NORVASC ) 5 MG tablet   4. Which pharmacy/location (including street and city if local pharmacy) is medication to be sent to?  [id number] [place], [place] - [address]     5. Do they need a 30 day or 90 day supply? 90   "

## 2024-05-27 ENCOUNTER — Ambulatory Visit: Admitting: Cardiovascular Disease
